# Patient Record
Sex: Male | Born: 1980 | Race: Black or African American | Hispanic: No | Marital: Married | Smoking: Never smoker
Health system: Southern US, Community
[De-identification: ages and names within clinical notes are randomized; demographics above are authoritative.]

## PROBLEM LIST (undated history)

## (undated) DIAGNOSIS — E119 Type 2 diabetes mellitus without complications: Secondary | ICD-10-CM

## (undated) DIAGNOSIS — I10 Essential (primary) hypertension: Secondary | ICD-10-CM

---

## 2009-09-10 ENCOUNTER — Emergency Department (HOSPITAL_COMMUNITY): Admission: EM | Admit: 2009-09-10 | Discharge: 2009-09-10 | Payer: Self-pay | Admitting: Family Medicine

## 2009-11-13 ENCOUNTER — Emergency Department (HOSPITAL_COMMUNITY): Admission: EM | Admit: 2009-11-13 | Discharge: 2009-11-13 | Payer: Self-pay | Admitting: Family Medicine

## 2012-12-12 ENCOUNTER — Emergency Department (HOSPITAL_COMMUNITY): Payer: Self-pay

## 2012-12-12 ENCOUNTER — Encounter (HOSPITAL_COMMUNITY): Payer: Self-pay | Admitting: Emergency Medicine

## 2012-12-12 ENCOUNTER — Emergency Department (HOSPITAL_COMMUNITY)
Admission: EM | Admit: 2012-12-12 | Discharge: 2012-12-12 | Disposition: A | Discharge status: Home / Self Care | Payer: Self-pay | Attending: Emergency Medicine | Admitting: Emergency Medicine

## 2012-12-12 DIAGNOSIS — M79609 Pain in unspecified limb: Secondary | ICD-10-CM | POA: Insufficient documentation

## 2012-12-12 DIAGNOSIS — M79671 Pain in right foot: Secondary | ICD-10-CM

## 2012-12-12 DIAGNOSIS — F17200 Nicotine dependence, unspecified, uncomplicated: Secondary | ICD-10-CM | POA: Insufficient documentation

## 2012-12-12 MED ORDER — IBUPROFEN 800 MG PO TABS: 800.0000 mg | ORAL_TABLET | Freq: Three times a day (TID) | ORAL | Status: DC | PRN

## 2012-12-12 MED ORDER — HYDROCODONE-ACETAMINOPHEN 5-325 MG PO TABS: 1.0000 | ORAL_TABLET | Freq: Four times a day (QID) | ORAL | Status: DC | PRN

## 2012-12-12 MED ORDER — HYDROCODONE-ACETAMINOPHEN 5-325 MG PO TABS
1.0000 | ORAL_TABLET | Freq: Once | ORAL | Status: AC
  Administered 2012-12-12: 1 via ORAL
  Filled 2012-12-12: qty 1

## 2012-12-12 MED ORDER — IBUPROFEN 800 MG PO TABS
800.0000 mg | ORAL_TABLET | Freq: Once | ORAL | Status: AC
  Administered 2012-12-12: 800 mg via ORAL
  Filled 2012-12-12: qty 1

## 2012-12-12 NOTE — ED Notes (Signed)
Pain in r/foot 2 days after playing basketball. Denies trauma

## 2012-12-12 NOTE — ED Provider Notes (Signed)
   History    CSN: [id number] Arrival date & time 12/12/12  1235  First MD Initiated Contact with Patient 12/12/12 1240     Chief Complaint  Patient presents with  . Foot Pain    Pain in top of r/foot 48 hrs hours after playing basketball   (Consider location/radiation/quality/duration/timing/severity/associated sxs/prior Treatment) HPI Patient presents to the emergency department with right foot pain that began 2 days, ago.  Patient, states he played basketball on Sunday, and noticed 2 days, ago he was having pain in his right foot.  Patient, states he does not recall a trauma to the foot.  Patient, states, that it hurts to put pressure on his foot.  He states, yesterday it hurt to press against his foot, but not today.  Patient denies numbness, weakness, fever, redness, nausea, or vomiting. History reviewed. No pertinent past medical history. History reviewed. No pertinent past surgical history. Family History  Problem Relation Age of Onset  . Hypertension Father    History  Substance Use Topics  . Smoking status: Current Some Day Smoker    Types: Cigarettes  . Smokeless tobacco: Not on file  . Alcohol Use: No    Review of Systems All other systems negative except as documented in the HPI. All pertinent positives and negatives as reviewed in the HPI. Allergies  Review of patient's allergies indicates no known allergies.  Home Medications   Current Outpatient Rx  Name  Route  Sig  Dispense  Refill  . acetaminophen (TYLENOL) 500 MG tablet   Oral   Take 500 mg by mouth every 6 (six) hours as needed for pain (pain).          BP 132/82  Pulse 76  Temp(Src) 98.4 F (36.9 C) (Oral)  SpO2 100% Physical Exam  Nursing note and vitals reviewed. Constitutional: He is oriented to person, place, and time. He appears well-developed and well-nourished.  Pulmonary/Chest: Effort normal and breath sounds normal. No respiratory distress.  Musculoskeletal:       Right foot: He  exhibits swelling. He exhibits normal range of motion, no tenderness, no crepitus and no deformity.       Feet:  Neurological: He is alert and oriented to person, place, and time.  Skin: Skin is warm and dry. No rash noted. No erythema.    ED Course  Procedures (including critical care time) Patient will be advised ice and elevate his foot.  Told to return here as, needed.  He'll be given orthopedic followup as well.  X-rays were reviewed and is no acute findings.  Patient does not have any signs of cellulitis or infection  MDM    Carlyle Dolly, PA-C 12/12/12 1424

## 2012-12-12 NOTE — Progress Notes (Signed)
[facility] has seen patient. Pt stated that he was pending insurance through his job. Provided pt with primary care resources.

## 2012-12-13 NOTE — ED Provider Notes (Signed)
Medical screening examination/treatment/procedure(s) were performed by non-physician practitioner and as supervising physician I was immediately available for consultation/collaboration.  Ashle Stief, MD 12/13/12 0729 

## 2014-11-03 ENCOUNTER — Emergency Department (HOSPITAL_COMMUNITY): Payer: Self-pay

## 2014-11-03 ENCOUNTER — Encounter (HOSPITAL_COMMUNITY): Payer: Self-pay | Admitting: Emergency Medicine

## 2014-11-03 ENCOUNTER — Emergency Department (HOSPITAL_COMMUNITY)
Admission: EM | Admit: 2014-11-03 | Discharge: 2014-11-03 | Disposition: A | Discharge status: Home / Self Care | Payer: Self-pay | Attending: Emergency Medicine | Admitting: Emergency Medicine

## 2014-11-03 DIAGNOSIS — Y92838 Other recreation area as the place of occurrence of the external cause: Secondary | ICD-10-CM | POA: Insufficient documentation

## 2014-11-03 DIAGNOSIS — S4991XA Unspecified injury of right shoulder and upper arm, initial encounter: Secondary | ICD-10-CM | POA: Insufficient documentation

## 2014-11-03 DIAGNOSIS — X58XXXA Exposure to other specified factors, initial encounter: Secondary | ICD-10-CM | POA: Insufficient documentation

## 2014-11-03 DIAGNOSIS — Y9367 Activity, basketball: Secondary | ICD-10-CM | POA: Insufficient documentation

## 2014-11-03 DIAGNOSIS — Y998 Other external cause status: Secondary | ICD-10-CM | POA: Insufficient documentation

## 2014-11-03 DIAGNOSIS — Z791 Long term (current) use of non-steroidal anti-inflammatories (NSAID): Secondary | ICD-10-CM | POA: Insufficient documentation

## 2014-11-03 DIAGNOSIS — Z720 Tobacco use: Secondary | ICD-10-CM | POA: Insufficient documentation

## 2014-11-03 DIAGNOSIS — Z79899 Other long term (current) drug therapy: Secondary | ICD-10-CM | POA: Insufficient documentation

## 2014-11-03 MED ORDER — KETOROLAC TROMETHAMINE 60 MG/2ML IM SOLN
60.0000 mg | Freq: Once | INTRAMUSCULAR | Status: AC
  Administered 2014-11-03: 60 mg via INTRAMUSCULAR
  Filled 2014-11-03: qty 2
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
## 2014-11-03 NOTE — Discharge Instructions (Signed)
Return to the emergency room with worsening of symptoms, new symptoms or with symptoms that are concerning, especially fevers, redness, swelling, numbness, tingling or weakness. RICE: Rest, Ice (three cycles of 20 mins on, off at least twice a day), compression/brace, elevation. Heating pad works well for back pain. Ibuprofen  (2 tablets ) every 5-6 hours for 3-5 days. Wear the sling for comfort for the first 24-48 hours. Make sure you are moving her shoulder and full range of motion to prevent adhesive capsulitis. Follow up with PCP/orthopedist if symptoms worsen or are persistent. Read below information and follow recommendations. Impingement Syndrome, Rotator Cuff, Bursitis with Rehab Impingement syndrome is a condition that involves inflammation of the tendons of the rotator cuff and the subacromial bursa, that causes pain in the shoulder. The rotator cuff consists of four tendons and muscles that control much of the shoulder and upper arm function. The subacromial bursa is a fluid filled sac that helps reduce friction between the rotator cuff and one of the bones of the shoulder (acromion). Impingement syndrome is usually an overuse injury that causes swelling of the bursa (bursitis), swelling of the tendon (tendonitis), and/or a tear of the tendon (strain). Strains are classified into three categories. Grade 1 strains cause pain, but the tendon is not lengthened. Grade 2 strains include a lengthened ligament, due to the ligament being stretched or partially ruptured. With grade 2 strains there is still function, although the function may be decreased. Grade 3 strains include a complete tear of the tendon or muscle, and function is usually impaired. SYMPTOMS   Pain around the shoulder, often at the outer portion of the upper arm.  Pain that gets worse with shoulder function, especially when reaching overhead or lifting.  Sometimes, aching when not using the arm.  Pain that  wakes you up at night.  Sometimes, tenderness, swelling, warmth, or redness over the affected area.  Loss of strength.  Limited motion of the shoulder, especially reaching behind the back (to the back pocket or to unhook bra) or across your body.  Crackling sound (crepitation) when moving the arm.  Biceps tendon pain and inflammation (in the front of the shoulder). Worse when bending the elbow or lifting. CAUSES  Impingement syndrome is often an overuse injury, in which chronic (repetitive) motions cause the tendons or bursa to become inflamed. A strain occurs when a force is paced on the tendon or muscle that is greater than it can withstand. Common mechanisms of injury include: Stress from sudden increase in duration, frequency, or intensity of training.  Direct hit (trauma) to the shoulder.  Aging, erosion of the tendon with normal use.  Bony bump on shoulder (acromial spur). RISK INCREASES WITH:  Contact sports (football, wrestling, boxing).  Throwing sports (baseball, tennis, volleyball).  Weightlifting and bodybuilding.  Heavy labor.  Previous injury to the rotator cuff, including impingement.  Poor shoulder strength and flexibility.  Failure to warm up properly before activity.  Inadequate protective equipment.  Old age.  Bony bump on shoulder (acromial spur). PREVENTION   Warm up and stretch properly before activity.  Allow for adequate recovery between workouts.  Maintain physical fitness:  Strength, flexibility, and endurance.  Cardiovascular fitness.  Learn and use proper exercise technique. PROGNOSIS  If treated properly, impingement syndrome usually goes away within 6 weeks. Sometimes surgery is required.  RELATED COMPLICATIONS   Longer healing time if not properly treated, or if not given enough time to heal.  Recurring symptoms, that result in  a chronic condition.  Shoulder stiffness, frozen shoulder, or loss of motion.  Rotator cuff  tendon tear.  Recurring symptoms, especially if activity is resumed too soon, with overuse, with a direct blow, or when using poor technique. TREATMENT  Treatment first involves the use of ice and medicine, to reduce pain and inflammation. The use of strengthening and stretching exercises may help reduce pain with activity. These exercises may be performed at home or with a therapist. If non-surgical treatment is unsuccessful after more than 6 months, surgery may be advised. After surgery and rehabilitation, activity is usually possible in 3 months.  MEDICATION  If pain medicine is needed, nonsteroidal anti-inflammatory medicines (aspirin and ibuprofen), or other minor pain relievers (acetaminophen), are often advised.  Do not take pain medicine for 7 days before surgery.  Prescription pain relievers may be given, if your caregiver thinks they are needed. Use only as directed and only as much as you need.  Corticosteroid injections may be given by your caregiver. These injections should be reserved for the most serious cases, because they may only be given a certain number of times. HEAT AND COLD  Cold treatment (icing) should be applied for 10 to 15 minutes every 2 to 3 hours for inflammation and pain, and immediately after activity that aggravates your symptoms. Use ice packs or an ice massage.  Heat treatment may be used before performing stretching and strengthening activities prescribed by your caregiver, physical therapist, or athletic trainer. Use a heat pack or a warm water soak. SEEK MEDICAL CARE IF:   Symptoms get worse or do not improve in 4 to 6 weeks, despite treatment.  New, unexplained symptoms develop. (Drugs used in treatment may produce side effects.) EXERCISES  RANGE OF MOTION (ROM) AND STRETCHING EXERCISES - Impingement Syndrome (Rotator Cuff  Tendinitis, Bursitis) These exercises may help you when beginning to rehabilitate your injury. Your symptoms may go away with or  without further involvement from your physician, physical therapist or athletic trainer. While completing these exercises, remember:   Restoring tissue flexibility helps normal motion to return to the joints. This allows healthier, less painful movement and activity.  An effective stretch should be held for at least 30 seconds.  A stretch should never be painful. You should only feel a gentle lengthening or release in the stretched tissue. STRETCH - Flexion, Standing  Stand with good posture. With an underhand grip on your right / left hand, and an overhand grip on the opposite hand, grasp a broomstick or cane so that your hands are a little more than shoulder width apart.  Keeping your right / left elbow straight and shoulder muscles relaxed, push the stick with your opposite hand, to raise your right / left arm in front of your body and then overhead. Raise your arm until you feel a stretch in your right / left shoulder, but before you have increased shoulder pain.  Try to avoid shrugging your right / left shoulder as your arm rises, by keeping your shoulder blade tucked down and toward your mid-back spine. Hold for __________ seconds.  Slowly return to the starting position. Repeat __________ times. Complete this exercise __________ times per day. STRETCH - Abduction, Supine  Lie on your back. With an underhand grip on your right / left hand and an overhand grip on the opposite hand, grasp a broomstick or cane so that your hands are a little more than shoulder width apart.  Keeping your right / left elbow straight and your  shoulder muscles relaxed, push the stick with your opposite hand, to raise your right / left arm out to the side of your body and then overhead. Raise your arm until you feel a stretch in your right / left shoulder, but before you have increased shoulder pain.  Try to avoid shrugging your right / left shoulder as your arm rises, by keeping your shoulder blade tucked down  and toward your mid-back spine. Hold for __________ seconds.  Slowly return to the starting position. Repeat __________ times. Complete this exercise __________ times per day. ROM - Flexion, Active-Assisted  Lie on your back. You may bend your knees for comfort.  Grasp a broomstick or cane so your hands are about shoulder width apart. Your right / left hand should grip the end of the stick, so that your hand is positioned "thumbs-up," as if you were about to shake hands.  Using your healthy arm to lead, raise your right / left arm overhead, until you feel a gentle stretch in your shoulder. Hold for __________ seconds.  Use the stick to assist in returning your right / left arm to its starting position. Repeat __________ times. Complete this exercise __________ times per day.  ROM - Internal Rotation, Supine   Lie on your back on a firm surface. Place your right / left elbow about 60 degrees away from your side. Elevate your elbow with a folded towel, so that the elbow and shoulder are the same height.  Using a broomstick or cane and your strong arm, pull your right / left hand toward your body until you feel a gentle stretch, but no increase in your shoulder pain. Keep your shoulder and elbow in place throughout the exercise.  Hold for __________ seconds. Slowly return to the starting position. Repeat __________ times. Complete this exercise __________ times per day. STRETCH - Internal Rotation  Place your right / left hand behind your back, palm up.  Throw a towel or belt over your opposite shoulder. Grasp the towel with your right / left hand.  While keeping an upright posture, gently pull up on the towel, until you feel a stretch in the front of your right / left shoulder.  Avoid shrugging your right / left shoulder as your arm rises, by keeping your shoulder blade tucked down and toward your mid-back spine.  Hold for __________ seconds. Release the stretch, by lowering your  healthy hand. Repeat __________ times. Complete this exercise __________ times per day. ROM - Internal Rotation   Using an underhand grip, grasp a stick behind your back with both hands.  While standing upright with good posture, slide the stick up your back until you feel a mild stretch in the front of your shoulder.  Hold for __________ seconds. Slowly return to your starting position. Repeat __________ times. Complete this exercise __________ times per day.  STRETCH - Posterior Shoulder Capsule   Stand or sit with good posture. Grasp your right / left elbow and draw it across your chest, keeping it at the same height as your shoulder.  Pull your elbow, so your upper arm comes in closer to your chest. Pull until you feel a gentle stretch in the back of your shoulder.  Hold for __________ seconds. Repeat __________ times. Complete this exercise __________ times per day. STRENGTHENING EXERCISES - Impingement Syndrome (Rotator Cuff Tendinitis, Bursitis) These exercises may help you when beginning to rehabilitate your injury. They may resolve your symptoms with or without further involvement from your physician,  physical therapist or athletic trainer. While completing these exercises, remember:  Muscles can gain both the endurance and the strength needed for everyday activities through controlled exercises.  Complete these exercises as instructed by your physician, physical therapist or athletic trainer. Increase the resistance and repetitions only as guided.  You may experience muscle soreness or fatigue, but the pain or discomfort you are trying to eliminate should never worsen during these exercises. If this pain does get worse, stop and make sure you are following the directions exactly. If the pain is still present after adjustments, discontinue the exercise until you can discuss the trouble with your clinician.  During your recovery, avoid activity or exercises which involve actions  that place your injured hand or elbow above your head or behind your back or head. These positions stress the tissues which you are trying to heal. STRENGTH - Scapular Depression and Adduction   With good posture, sit on a firm chair. Support your arms in front of you, with pillows, arm rests, or on a table top. Have your elbows in line with the sides of your body.  Gently draw your shoulder blades down and toward your mid-back spine. Gradually increase the tension, without tensing the muscles along the top of your shoulders and the back of your neck.  Hold for __________ seconds. Slowly release the tension and relax your muscles completely before starting the next repetition.  After you have practiced this exercise, remove the arm support and complete the exercise in standing as well as sitting position. Repeat __________ times. Complete this exercise __________ times per day.  STRENGTH - Shoulder Abductors, Isometric  With good posture, stand or sit about 4-6 inches from a wall, with your right / left side facing the wall.  Bend your right / left elbow. Gently press your right / left elbow into the wall. Increase the pressure gradually, until you are pressing as hard as you can, without shrugging your shoulder or increasing any shoulder discomfort.  Hold for __________ seconds.  Release the tension slowly. Relax your shoulder muscles completely before you begin the next repetition. Repeat __________ times. Complete this exercise __________ times per day.  STRENGTH - External Rotators, Isometric  Keep your right / left elbow at your side and bend it 90 degrees.  Step into a door frame so that the outside of your right / left wrist can press against the door frame without your upper arm leaving your side.  Gently press your right / left wrist into the door frame, as if you were trying to swing the back of your hand away from your stomach. Gradually increase the tension, until you are  pressing as hard as you can, without shrugging your shoulder or increasing any shoulder discomfort.  Hold for __________ seconds.  Release the tension slowly. Relax your shoulder muscles completely before you begin the next repetition. Repeat __________ times. Complete this exercise __________ times per day.  STRENGTH - Supraspinatus   Stand or sit with good posture. Grasp a __________ weight, or an exercise band or tubing, so that your hand is "thumbs-up," like you are shaking hands.  Slowly lift your right / left arm in a "V" away from your thigh, diagonally into the space between your side and straight ahead. Lift your hand to shoulder height or as far as you can, without increasing any shoulder pain. At first, many people do not lift their hands above shoulder height.  Avoid shrugging your right / left shoulder as  your arm rises, by keeping your shoulder blade tucked down and toward your mid-back spine.  Hold for __________ seconds. Control the descent of your hand, as you slowly return to your starting position. Repeat __________ times. Complete this exercise __________ times per day.  STRENGTH - External Rotators  Secure a rubber exercise band or tubing to a fixed object (table, pole) so that it is at the same height as your right / left elbow when you are standing or sitting on a firm surface.  Stand or sit so that the secured exercise band is at your uninjured side.  Bend your right / left elbow 90 degrees. Place a folded towel or small pillow under your right / left arm, so that your elbow is a few inches away from your side.  Keeping the tension on the exercise band, pull it away from your body, as if pivoting on your elbow. Be sure to keep your body steady, so that the movement is coming only from your rotating shoulder.  Hold for __________ seconds. Release the tension in a controlled manner, as you return to the starting position. Repeat __________ times. Complete this  exercise __________ times per day.  STRENGTH - Internal Rotators   Secure a rubber exercise band or tubing to a fixed object (table, pole) so that it is at the same height as your right / left elbow when you are standing or sitting on a firm surface.  Stand or sit so that the secured exercise band is at your right / left side.  Bend your elbow 90 degrees. Place a folded towel or small pillow under your right / left arm so that your elbow is a few inches away from your side.  Keeping the tension on the exercise band, pull it across your body, toward your stomach. Be sure to keep your body steady, so that the movement is coming only from your rotating shoulder.  Hold for __________ seconds. Release the tension in a controlled manner, as you return to the starting position. Repeat __________ times. Complete this exercise __________ times per day.  STRENGTH - Scapular Protractors, Standing   Stand arms length away from a wall. Place your hands on the wall, keeping your elbows straight.  Begin by dropping your shoulder blades down and toward your mid-back spine.  To strengthen your protractors, keep your shoulder blades down, but slide them forward on your rib cage. It will feel as if you are lifting the back of your rib cage away from the wall. This is a subtle motion and can be challenging to complete. Ask your caregiver for further instruction, if you are not sure you are doing the exercise correctly.  Hold for __________ seconds. Slowly return to the starting position, resting the muscles completely before starting the next repetition. Repeat __________ times. Complete this exercise __________ times per day. STRENGTH - Scapular Protractors, Supine  Lie on your back on a firm surface. Extend your right / left arm straight into the air while holding a __________ weight in your hand.  Keeping your head and back in place, lift your shoulder off the floor.  Hold for __________ seconds. Slowly  return to the starting position, and allow your muscles to relax completely before starting the next repetition. Repeat __________ times. Complete this exercise __________ times per day. STRENGTH - Scapular Protractors, Quadruped  Get onto your hands and knees, with your shoulders directly over your hands (or as close as you can be, comfortably).  Keeping  your elbows locked, lift the back of your rib cage up into your shoulder blades, so your mid-back rounds out. Keep your neck muscles relaxed.  Hold this position for __________ seconds. Slowly return to the starting position and allow your muscles to relax completely before starting the next repetition. Repeat __________ times. Complete this exercise __________ times per day.  STRENGTH - Scapular Retractors  Secure a rubber exercise band or tubing to a fixed object (table, pole), so that it is at the height of your shoulders when you are either standing, or sitting on a firm armless chair.  With a palm down grip, grasp an end of the band in each hand. Straighten your elbows and lift your hands straight in front of you, at shoulder height. Step back, away from the secured end of the band, until it becomes tense.  Squeezing your shoulder blades together, draw your elbows back toward your sides, as you bend them. Keep your upper arms lifted away from your body throughout the exercise.  Hold for __________ seconds. Slowly ease the tension on the band, as you reverse the directions and return to the starting position. Repeat __________ times. Complete this exercise __________ times per day. STRENGTH - Shoulder Extensors   Secure a rubber exercise band or tubing to a fixed object (table, pole) so that it is at the height of your shoulders when you are either standing, or sitting on a firm armless chair.  With a thumbs-up grip, grasp an end of the band in each hand. Straighten your elbows and lift your hands straight in front of you, at shoulder  height. Step back, away from the secured end of the band, until it becomes tense.  Squeezing your shoulder blades together, pull your hands down to the sides of your thighs. Do not allow your hands to go behind you.  Hold for __________ seconds. Slowly ease the tension on the band, as you reverse the directions and return to the starting position. Repeat __________ times. Complete this exercise __________ times per day.  STRENGTH - Scapular Retractors and External Rotators   Secure a rubber exercise band or tubing to a fixed object (table, pole) so that it is at the height as your shoulders, when you are either standing, or sitting on a firm armless chair.  With a palm down grip, grasp an end of the band in each hand. Bend your elbows 90 degrees and lift your elbows to shoulder height, at your sides. Step back, away from the secured end of the band, until it becomes tense.  Squeezing your shoulder blades together, rotate your shoulders so that your upper arms and elbows remain stationary, but your fists travel upward to head height.  Hold for __________ seconds. Slowly ease the tension on the band, as you reverse the directions and return to the starting position. Repeat __________ times. Complete this exercise __________ times per day.  STRENGTH - Scapular Retractors and External Rotators, Rowing   Secure a rubber exercise band or tubing to a fixed object (table, pole) so that it is at the height of your shoulders, when you are either standing, or sitting on a firm armless chair.  With a palm down grip, grasp an end of the band in each hand. Straighten your elbows and lift your hands straight in front of you, at shoulder height. Step back, away from the secured end of the band, until it becomes tense.  Step 1: Squeeze your shoulder blades together. Bending your elbows, draw  your hands to your chest, as if you are rowing a boat. At the end of this motion, your hands and elbow should be at  shoulder height and your elbows should be out to your sides.  Step 2: Rotate your shoulders, to raise your hands above your head. Your forearms should be vertical and your upper arms should be horizontal.  Hold for __________ seconds. Slowly ease the tension on the band, as you reverse the directions and return to the starting position. Repeat __________ times. Complete this exercise __________ times per day.  STRENGTH - Scapular Depressors  Find a sturdy chair without wheels, such as a dining room chair.  Keeping your feet on the floor, and your hands on the chair arms, lift your bottom up from the seat, and lock your elbows.  Keeping your elbows straight, allow gravity to pull your body weight down. Your shoulders will rise toward your ears.  Raise your body against gravity by drawing your shoulder blades down your back, shortening the distance between your shoulders and ears. Although your feet should always maintain contact with the floor, your feet should progressively support less body weight, as you get stronger.  Hold for __________ seconds. In a controlled and slow manner, lower your body weight to begin the next repetition. Repeat __________ times. Complete this exercise __________ times per day.  Document Released: 05/22/2005 Document Revised: 08/14/2011 Document Reviewed: 09/03/2008 [facility] Patient Information 2015 [place], [place]. This information is not intended to replace advice given to you by your health care provider. Make sure you discuss any questions you have with your health care provider.

## 2014-11-03 NOTE — ED Provider Notes (Signed)
CSN: [id number]     Arrival date & time 11/03/14  1533 History  This chart was scribed for a non-physician practitioner, Oswaldo Conroy, PA-C working with Lorre Nick, MD by [place] Peace, ED Scribe. The patient was seen in [facility]. The patient's care was started at 5:44 PM.    Chief Complaint  Patient presents with  . Shoulder Injury     Patient is a 34 y.o. male presenting with shoulder injury. The history is provided by the patient. No language interpreter was used.  Shoulder Injury  HPI Comments: Carlos Mccoy is a 34 y.o. male who presents to the Emergency Department complaining of new improving right shoulder injury onset 1 day prior that occurred while playing basketball. Pt states that certain movements make the pain worse. Pt states that he has tried Advil that provide slight relief. Pt states that he feels as if his shoulder is slightly "slumped." No numbness, tingling, redness, swelling, fever or chills.    History reviewed. No pertinent past medical history. History reviewed. No pertinent past surgical history. Family History  Problem Relation Age of Onset  . Hypertension Father    History  Substance Use Topics  . Smoking status: Current Some Day Smoker    Types: Cigarettes  . Smokeless tobacco: Not on file  . Alcohol Use: No    Review of Systems  Constitutional: Negative for fever and chills.  Musculoskeletal: Positive for arthralgias. Negative for joint swelling.  Skin: Negative for color change.  Neurological: Negative for weakness and numbness.      Allergies  Review of patient's allergies indicates no known allergies.  Home Medications   Prior to Admission medications   Medication Sig Start Date End Date Taking? Authorizing Provider  acetaminophen (TYLENOL) 500 MG tablet Take 500 mg by mouth every 6 (six) hours as needed for pain (pain).    Historical Provider, MD  HYDROcodone-acetaminophen (NORCO/VICODIN) 5-325 MG per tablet Take 1 tablet by  mouth every 6 (six) hours as needed for pain. 12/12/12   Charlestine Night, PA-C  ibuprofen (ADVIL,MOTRIN) 800 MG tablet Take 1 tablet (800 mg total) by mouth every 8 (eight) hours as needed for pain. 12/12/12   Christopher Lawyer, PA-C   BP 124/77 mmHg  Pulse 69  Temp(Src) 98.6 F (37 C) (Oral)  Resp 18  Ht  (1.803 m)  Wt 255 lb (115.667 kg)  BMI 35.58 kg/m2  SpO2 100%  Physical Exam  Constitutional: He appears well-developed and well-nourished. No distress.  HENT:  Head: Normocephalic and atraumatic.  Eyes: Conjunctivae are normal. Right eye exhibits no discharge. Left eye exhibits no discharge.  Pulmonary/Chest: Effort normal. No respiratory distress.  Musculoskeletal: He exhibits tenderness.  Right shoulder: No clavicular step off, tenderness to [facility] joint, no right shoulder deformity. Pain worse with shoulder abduction. 5/5 strength in bilateral upper extremities, sensation intact, 2+ radial pulses.   Neurological: He is alert. Coordination normal.  Skin: He is not diaphoretic.  Psychiatric: He has a normal mood and affect. His behavior is normal.  Nursing note and vitals reviewed.   ED Course  Procedures (including critical care time) DIAGNOSTIC STUDIES: Oxygen Saturation is 100% on RA, normal by my interpretation.    COORDINATION OF CARE: 6:05 PM-Discussed treatment plan with pt at bedside and pt agreed to plan.     Labs Review Labs Reviewed - No data to display  Imaging Review Dg Shoulder Right  11/03/2014   CLINICAL DATA:  Acute right shoulder pain after basketball injury. Initial encounter.  EXAM: RIGHT SHOULDER - 2+ VIEW  COMPARISON:  None.  FINDINGS: There is no evidence of fracture or dislocation. There is no evidence of arthropathy or other focal bone abnormality. Soft tissues are unremarkable.  IMPRESSION: Normal right shoulder.   Electronically Signed   By: Lupita RaiderJames  Green Jr, M.D.   On: 11/03/2014 17:01     EKG Interpretation None     Medications   ketorolac (TORADOL) injection 60 mg (not administered)      MDM   Final diagnoses:  Right shoulder injury, initial encounter   Patient X-Ray negative for obvious fracture or dislocation. Pain managed in ED. Pt without erythema, edema or warmth to joint. Neurovascularly intact. I doubt septic arthritis. Pt with possible shoulder impingement. Pt advised to follow up with PCP/orthopedics if symptoms persist. Patient given brace while in ED, RICE, ibuprofen and conservative therapy recommended and discussed. Instructed patient on ROM exercises and wearing sling only for first 24-48hours  Discussed return precautions with patient. Discussed all results and patient verbalizes understanding and agrees with plan.  I personally performed the services described in this documentation, which was scribed in my presence. The recorded information has been reviewed and is accurate.   Oswaldo ConroyVictoria Amita Atayde, PA-C 11/03/14 1817  Lorre NickAnthony Allen, MD 11/03/14 [phone]

## 2016-06-13 IMAGING — CR DG SHOULDER 2+V*R*
2 series · 2 of 2 positions shown · non-contrast
Comparison: None.

CLINICAL DATA: Acute right shoulder pain after basketball injury.
Initial encounter.

EXAM:
RIGHT SHOULDER - 2+ VIEW

[w shoulder external right]
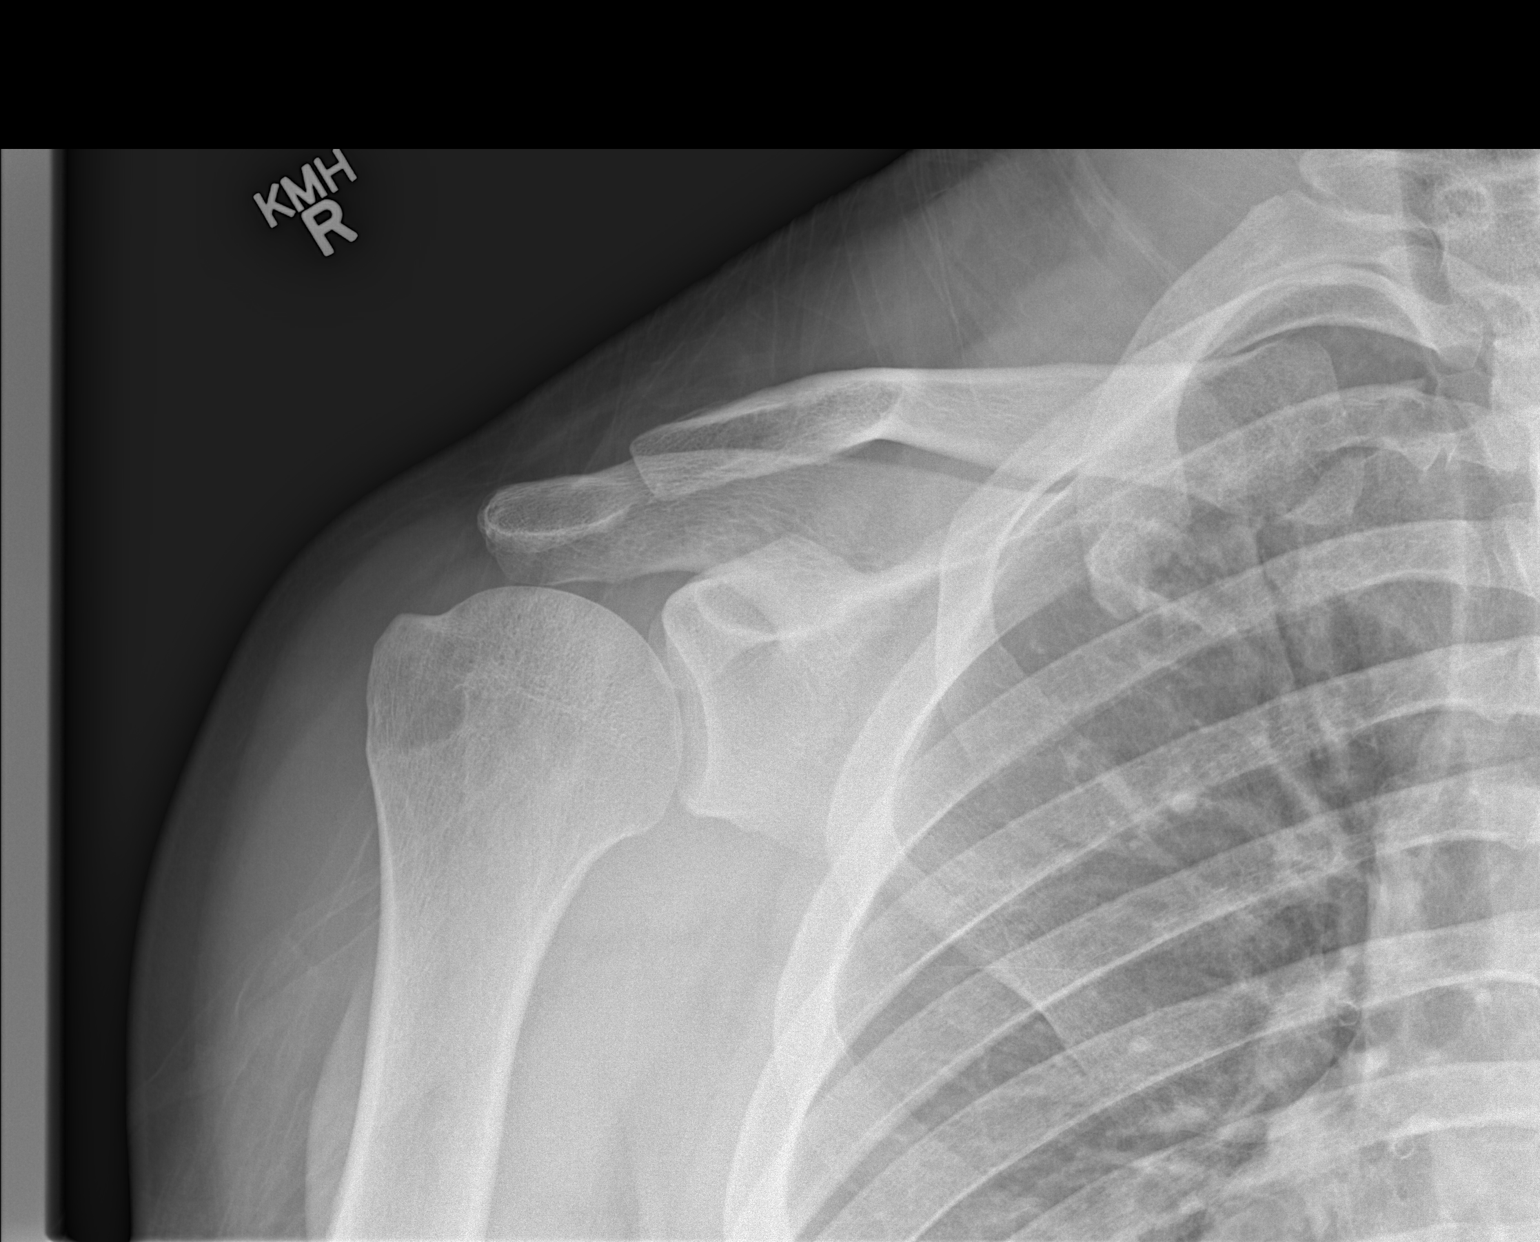

[w shoulder y-view right]
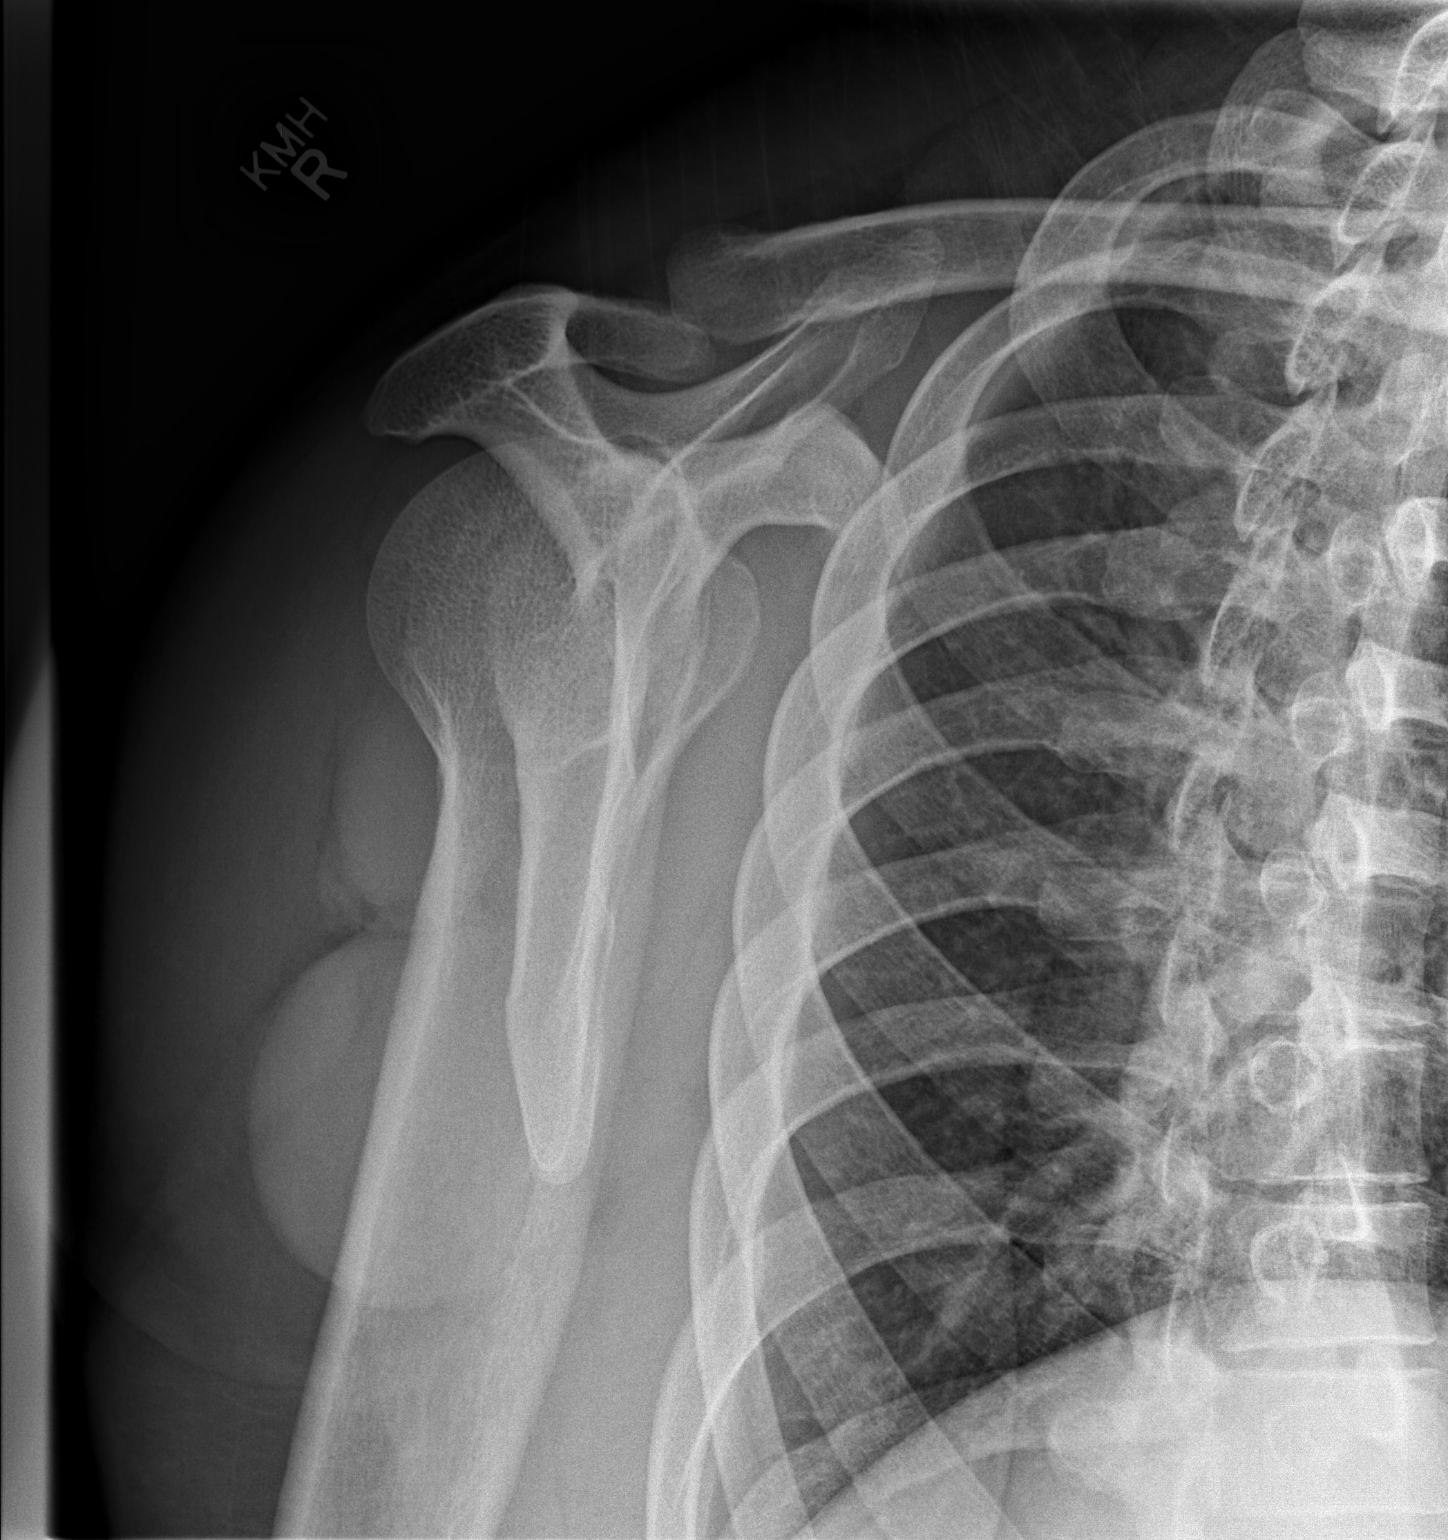

[2 of 2 positions shown; findings below may reference images not displayed]

FINDINGS: There is no evidence of fracture or dislocation. There is no
evidence of arthropathy or other focal bone abnormality. Soft
tissues are unremarkable.
IMPRESSION: Normal right shoulder.

## 2017-01-12 ENCOUNTER — Emergency Department (HOSPITAL_COMMUNITY)
Admission: EM | Admit: 2017-01-12 | Discharge: 2017-01-12 | Disposition: A | Discharge status: Home / Self Care | Payer: Self-pay | Attending: Emergency Medicine | Admitting: Emergency Medicine

## 2017-01-12 DIAGNOSIS — S86111A Strain of other muscle(s) and tendon(s) of posterior muscle group at lower leg level, right leg, initial encounter: Secondary | ICD-10-CM | POA: Insufficient documentation

## 2017-01-12 DIAGNOSIS — F17210 Nicotine dependence, cigarettes, uncomplicated: Secondary | ICD-10-CM | POA: Insufficient documentation

## 2017-01-12 DIAGNOSIS — Y9367 Activity, basketball: Secondary | ICD-10-CM | POA: Insufficient documentation

## 2017-01-12 DIAGNOSIS — X500XXA Overexertion from strenuous movement or load, initial encounter: Secondary | ICD-10-CM | POA: Insufficient documentation

## 2017-01-12 DIAGNOSIS — Y92310 Basketball court as the place of occurrence of the external cause: Secondary | ICD-10-CM | POA: Insufficient documentation

## 2017-01-12 DIAGNOSIS — Y999 Unspecified external cause status: Secondary | ICD-10-CM | POA: Insufficient documentation

## 2017-01-12 MED ORDER — METHOCARBAMOL 500 MG PO TABS: 1000.0000 mg | ORAL_TABLET | Freq: Three times a day (TID) | ORAL | 0 refills | Status: DC | PRN

## 2017-01-12 MED ORDER — NAPROXEN 500 MG PO TABS: 500.0000 mg | ORAL_TABLET | Freq: Two times a day (BID) | ORAL | 0 refills | Status: DC

## 2017-01-12 NOTE — ED Provider Notes (Signed)
[facility]-EMERGENCY DEPT Provider Note   CSN: [id number] Arrival date & time: 01/12/17  0845     History   Chief Complaint Chief Complaint  Patient presents with  . Leg Injury    right    HPI Carlos Mccoy is a 36 y.o. male.  HPI Patient was playing basketball day before yesterday and came down on his right leg from a jump. He immediately felt a big pop in the back of his calf. He reports at first he thought somebody might I am really hard in the calf. He reports it was immediately painful and started to get more and more swollen. Patient reports he can put some weight on it at a particular angle but trying to go up or down stairs is very difficult. He has been using crutches from a family member. He has taken ibuprofen with some relief. No past medical history on file.  There are no active problems to display for this patient.   No past surgical history on file.     Home Medications    Prior to Admission medications   Medication Sig Start Date End Date Taking? Authorizing Provider  acetaminophen (TYLENOL) 500 MG tablet Take 500 mg by mouth every 6 (six) hours as needed for pain (pain).    [provider]  HYDROcodone-acetaminophen (NORCO/VICODIN) 5-325 MG per tablet Take 1 tablet by mouth every 6 (six) hours as needed for pain. 12/12/12   Lawyer, Cristal Deerhristopher, PA-C  ibuprofen (ADVIL,MOTRIN) 800 MG tablet Take 1 tablet (800 mg total) by mouth every 8 (eight) hours as needed for pain. 12/12/12   Lawyer, Cristal Deerhristopher, PA-C  methocarbamol (ROBAXIN) 500 MG tablet Take 2 tablets (1,000 mg total) by mouth every 8 (eight) hours as needed for muscle spasms. 01/12/17   Arby BarrettePfeiffer, Leslee Suire, MD  naproxen (NAPROSYN) 500 MG tablet Take 1 tablet (500 mg total) by mouth 2 (two) times daily. 01/12/17   Arby BarrettePfeiffer, Kabeer Hoagland, MD    Family History Family History  Problem Relation Age of Onset  . Hypertension Father     Social History Social History  Substance Use Topics  . Smoking status:  Current Some Day Smoker    Types: Cigarettes  . Smokeless tobacco: Not on file  . Alcohol use No     Allergies   Patient has no known allergies.   Review of Systems Review of Systems Constitutional: No fever no chills no malaise Respiratory: No shortness of breath no cough no chest pain  Physical Exam Updated Vital Signs BP 140/89   Pulse 97   Temp 98.4 F (36.9 C)   Resp 18   SpO2 99%   Physical Exam  Constitutional: He is oriented to person, place, and time. He appears well-developed and well-nourished. No distress.  HENT:  Head: Normocephalic and atraumatic.  Eyes: EOM are normal.  Pulmonary/Chest: Effort normal.  Musculoskeletal:  Patient has large diffuse swelling of the right gastrocnemius. It is about 30-40% swollen relative to the left. At baseline however the patient does have very large calf muscles. Patient placed in supine position and Achilles tested. Achilles intact. No pain to palpation along the Achilles and although patient is tender to compression of the calf, normal extension of the foot. Bilateral dorsalis pedis pulses 2+ and symmetric. No general edema of the lower portion of the leg. No pain along palpation of the bony prominences the tibia.  Neurological: He is alert and oriented to person, place, and time. No cranial nerve deficit. He exhibits normal muscle tone. Coordination  normal.  Skin: Skin is warm and dry.  Psychiatric: He has a normal mood and affect.     ED Treatments / Results  Labs (all labs ordered are listed, but only abnormal results are displayed) Labs Reviewed - No data to display  EKG  EKG Interpretation None       Radiology No results found.  Procedures Procedures (including critical care time)  Medications Ordered in ED Medications - No data to display   Initial Impression / Assessment and Plan / ED Course  I have reviewed the triage vital signs and the nursing notes.  Pertinent labs & imaging results that were  available during my care of the patient were reviewed by me and considered in my medical decision making (see chart for details).      Final Clinical Impressions(s) / ED Diagnoses   Final diagnoses:  Gastrocnemius muscle rupture, right, initial encounter   Physical exam consistent with significant gastrocnemius partial tear versus rupture. No neurovascular compromise. Patient does not have any foot pain or paresthesia and normal neurovascular exam. Patient has been counseled on signs of compartment syndrome. Patient is counseled on compression elevating icing and nonweightbearing for the next several days. He is counseled on a follow-up plan within 5-7 days. New Prescriptions New Prescriptions   METHOCARBAMOL (ROBAXIN) 500 MG TABLET    Take 2 tablets (1,000 mg total) by mouth every 8 (eight) hours as needed for muscle spasms.   NAPROXEN (NAPROSYN) 500 MG TABLET    Take 1 tablet (500 mg total) by mouth 2 (two) times daily.     Arby Barrette, MD 01/12/17 [phone]

## 2017-01-12 NOTE — Discharge Instructions (Signed)
See your family doctor or orthopedic doctor for recheck in the next 5-7 days.  Although there are no signs of compartment syndrome at this time, follow precautionary statements and return if any symptoms develop.  Wear an Ace wrap or a knee sleeve on your calf to help with swelling. Once the swelling goes down adequately that a walking boot fits, you may start using a walking boot for weightbearing.

## 2017-01-12 NOTE — ED Notes (Signed)
ACE WRAPPED APPLIED BY JONNA NT TO RT LE. PT TOLERATED. GOOD CMS

## 2017-01-12 NOTE — ED Triage Notes (Signed)
Pt reports right leg injury when playing Basketball 2 days ago. Obvious right calf swelling. Pt  unable to bare weight on injured extremity. No redness nor hot to touch. Denies shortness of breath nor chest pain.

## 2024-01-11 ENCOUNTER — Ambulatory Visit: Payer: Self-pay

## 2024-01-12 ENCOUNTER — Encounter (HOSPITAL_COMMUNITY): Payer: Self-pay | Admitting: Internal Medicine

## 2024-01-12 ENCOUNTER — Ambulatory Visit

## 2024-01-12 ENCOUNTER — Ambulatory Visit
Admission: RE | Admit: 2024-01-12 | Discharge: 2024-01-12 | Disposition: A | Discharge status: Home / Self Care | Attending: Physician Assistant | Admitting: Physician Assistant

## 2024-01-12 ENCOUNTER — Other Ambulatory Visit: Payer: Self-pay

## 2024-01-12 ENCOUNTER — Inpatient Hospital Stay (HOSPITAL_COMMUNITY)
Admission: EM | Admit: 2024-01-12 | Discharge: 2024-01-15 | DRG: 638 | Disposition: A | Discharge status: Home / Self Care | Attending: Internal Medicine | Admitting: Internal Medicine

## 2024-01-12 VITALS — BP 130/87 | HR 122 | Temp 98.1°F | Resp 19 | Ht 72.0 in | Wt 270.0 lb

## 2024-01-12 DIAGNOSIS — Z833 Family history of diabetes mellitus: Secondary | ICD-10-CM

## 2024-01-12 DIAGNOSIS — R350 Frequency of micturition: Secondary | ICD-10-CM

## 2024-01-12 DIAGNOSIS — N179 Acute kidney failure, unspecified: Secondary | ICD-10-CM | POA: Diagnosis present

## 2024-01-12 DIAGNOSIS — I10 Essential (primary) hypertension: Secondary | ICD-10-CM | POA: Diagnosis present

## 2024-01-12 DIAGNOSIS — D751 Secondary polycythemia: Secondary | ICD-10-CM | POA: Diagnosis present

## 2024-01-12 DIAGNOSIS — Z87891 Personal history of nicotine dependence: Secondary | ICD-10-CM

## 2024-01-12 DIAGNOSIS — R058 Other specified cough: Secondary | ICD-10-CM | POA: Diagnosis not present

## 2024-01-12 DIAGNOSIS — R000 Tachycardia, unspecified: Secondary | ICD-10-CM | POA: Diagnosis present

## 2024-01-12 DIAGNOSIS — Z6836 Body mass index (BMI) 36.0-36.9, adult: Secondary | ICD-10-CM

## 2024-01-12 DIAGNOSIS — R824 Acetonuria: Secondary | ICD-10-CM

## 2024-01-12 DIAGNOSIS — D72829 Elevated white blood cell count, unspecified: Secondary | ICD-10-CM | POA: Diagnosis present

## 2024-01-12 DIAGNOSIS — R030 Elevated blood-pressure reading, without diagnosis of hypertension: Secondary | ICD-10-CM | POA: Diagnosis present

## 2024-01-12 DIAGNOSIS — E66812 Obesity, class 2: Secondary | ICD-10-CM | POA: Diagnosis present

## 2024-01-12 DIAGNOSIS — R81 Glycosuria: Secondary | ICD-10-CM | POA: Diagnosis not present

## 2024-01-12 DIAGNOSIS — R739 Hyperglycemia, unspecified: Secondary | ICD-10-CM | POA: Diagnosis not present

## 2024-01-12 DIAGNOSIS — E860 Dehydration: Secondary | ICD-10-CM | POA: Diagnosis present

## 2024-01-12 DIAGNOSIS — R7989 Other specified abnormal findings of blood chemistry: Secondary | ICD-10-CM | POA: Diagnosis present

## 2024-01-12 DIAGNOSIS — E8809 Other disorders of plasma-protein metabolism, not elsewhere classified: Secondary | ICD-10-CM | POA: Diagnosis present

## 2024-01-12 DIAGNOSIS — E1110 Type 2 diabetes mellitus with ketoacidosis without coma: Secondary | ICD-10-CM | POA: Diagnosis not present

## 2024-01-12 DIAGNOSIS — Z8249 Family history of ischemic heart disease and other diseases of the circulatory system: Secondary | ICD-10-CM

## 2024-01-12 DIAGNOSIS — E871 Hypo-osmolality and hyponatremia: Secondary | ICD-10-CM | POA: Diagnosis present

## 2024-01-12 LAB — MAGNESIUM: Magnesium: 2.3 mg/dL (ref 1.7–2.4)

## 2024-01-12 LAB — I-STAT CHEM 8, ED
BUN: 21 mg/dL — ABNORMAL HIGH (ref 6–20)
Calcium, Ion: 1.22 mmol/L (ref 1.15–1.40)
Chloride: 91 mmol/L — ABNORMAL LOW (ref 98–111)
Creatinine, Ser: 1.4 mg/dL — ABNORMAL HIGH (ref 0.61–1.24)
Glucose, Bld: 637 mg/dL (ref 70–99)
HCT: 55 % — ABNORMAL HIGH (ref 39.0–52.0)
Hemoglobin: 18.7 g/dL — ABNORMAL HIGH (ref 13.0–17.0)
Potassium: 4.8 mmol/L (ref 3.5–5.1)
Sodium: 126 mmol/L — ABNORMAL LOW (ref 135–145)
TCO2: 18 mmol/L — ABNORMAL LOW (ref 22–32)

## 2024-01-12 LAB — CBC
HCT: 50.8 % (ref 39.0–52.0)
Hemoglobin: 18.2 g/dL — ABNORMAL HIGH (ref 13.0–17.0)
MCH: 30.5 pg (ref 26.0–34.0)
MCHC: 35.8 g/dL (ref 30.0–36.0)
MCV: 85.2 fL (ref 80.0–100.0)
Platelets: 362 K/uL (ref 150–400)
RBC: 5.96 MIL/uL — ABNORMAL HIGH (ref 4.22–5.81)
RDW: 11 % — ABNORMAL LOW (ref 11.5–15.5)
WBC: 12 K/uL — ABNORMAL HIGH (ref 4.0–10.5)
nRBC: 0 % (ref 0.0–0.2)

## 2024-01-12 LAB — BETA-HYDROXYBUTYRIC ACID
Beta-Hydroxybutyric Acid: 5.92 mmol/L — ABNORMAL HIGH (ref 0.05–0.27)
Beta-Hydroxybutyric Acid: 4.92 mmol/L — ABNORMAL HIGH (ref 0.05–0.27)
Beta-Hydroxybutyric Acid: 1.87 mmol/L — ABNORMAL HIGH (ref 0.05–0.27)

## 2024-01-12 LAB — LIPID PANEL
Cholesterol: 212 mg/dL — ABNORMAL HIGH (ref 0–200)
HDL: 29 mg/dL — ABNORMAL LOW (ref >40)
LDL Cholesterol: 133 mg/dL — ABNORMAL HIGH (ref 0–99)
Total CHOL/HDL Ratio: 7.3 ratio
Triglycerides: 251 mg/dL — ABNORMAL HIGH (ref <150)
VLDL: 50 mg/dL — ABNORMAL HIGH (ref 0–40)

## 2024-01-12 LAB — COMPREHENSIVE METABOLIC PANEL WITH GFR
ALT: 31 U/L (ref 0–44)
AST: 19 U/L (ref 15–41)
Albumin: 5.1 g/dL — ABNORMAL HIGH (ref 3.5–5.0)
Alkaline Phosphatase: 137 U/L — ABNORMAL HIGH (ref 38–126)
Anion gap: 23 — ABNORMAL HIGH (ref 5–15)
BUN: 20 mg/dL (ref 6–20)
CO2: 17 mmol/L — ABNORMAL LOW (ref 22–32)
Calcium: 10.3 mg/dL (ref 8.9–10.3)
Chloride: 86 mmol/L — ABNORMAL LOW (ref 98–111)
Creatinine, Ser: 1.54 mg/dL — ABNORMAL HIGH (ref 0.61–1.24)
GFR, Estimated: 58 mL/min — ABNORMAL LOW (ref >60)
Glucose, Bld: 592 mg/dL (ref 70–99)
Potassium: 4.7 mmol/L (ref 3.5–5.1)
Sodium: 126 mmol/L — ABNORMAL LOW (ref 135–145)
Total Bilirubin: 3 mg/dL — ABNORMAL HIGH (ref 0.0–1.2)
Total Protein: 9.3 g/dL — ABNORMAL HIGH (ref 6.5–8.1)

## 2024-01-12 LAB — POCT URINE DIPSTICK
Bilirubin, UA: NEGATIVE
Glucose, UA: 500 mg/dL — AB
Leukocytes, UA: NEGATIVE
Nitrite, UA: NEGATIVE
POC PROTEIN,UA: NEGATIVE
Spec Grav, UA: 1.02 (ref 1.010–1.025)
Urobilinogen, UA: 0.2 U/dL
pH, UA: 5 (ref 5.0–8.0)

## 2024-01-12 LAB — GLUCOSE, CAPILLARY
Glucose-Capillary: 480 mg/dL — ABNORMAL HIGH (ref 70–99)
Glucose-Capillary: 542 mg/dL (ref 70–99)
Glucose-Capillary: 383 mg/dL — ABNORMAL HIGH (ref 70–99)
Glucose-Capillary: 295 mg/dL — ABNORMAL HIGH (ref 70–99)
Glucose-Capillary: 251 mg/dL — ABNORMAL HIGH (ref 70–99)
Glucose-Capillary: 174 mg/dL — ABNORMAL HIGH (ref 70–99)
Glucose-Capillary: 194 mg/dL — ABNORMAL HIGH (ref 70–99)
Glucose-Capillary: 205 mg/dL — ABNORMAL HIGH (ref 70–99)
Glucose-Capillary: 191 mg/dL — ABNORMAL HIGH (ref 70–99)
Glucose-Capillary: 192 mg/dL — ABNORMAL HIGH (ref 70–99)

## 2024-01-12 LAB — BLOOD GAS, VENOUS
Acid-base deficit: 6.5 mmol/L — ABNORMAL HIGH (ref 0.0–2.0)
Bicarbonate: 19.7 mmol/L — ABNORMAL LOW (ref 20.0–28.0)
O2 Saturation: 41.9 %
Patient temperature: 37
pCO2, Ven: 41 mmHg — ABNORMAL LOW (ref 44–60)
pH, Ven: 7.29 (ref 7.25–7.43)
pO2, Ven: <31 mmHg — CL (ref 32–45)

## 2024-01-12 LAB — BASIC METABOLIC PANEL WITH GFR
Anion gap: 17 — ABNORMAL HIGH (ref 5–15)
BUN: 20 mg/dL (ref 6–20)
CO2: 21 mmol/L — ABNORMAL LOW (ref 22–32)
Calcium: 10.1 mg/dL (ref 8.9–10.3)
Chloride: 94 mmol/L — ABNORMAL LOW (ref 98–111)
Creatinine, Ser: 1.34 mg/dL — ABNORMAL HIGH (ref 0.61–1.24)
GFR, Estimated: >60 mL/min (ref >60)
Glucose, Bld: 410 mg/dL — ABNORMAL HIGH (ref 70–99)
Potassium: 4.3 mmol/L (ref 3.5–5.1)
Sodium: 132 mmol/L — ABNORMAL LOW (ref 135–145)

## 2024-01-12 LAB — CK: Total CK: 121 U/L (ref 49–397)

## 2024-01-12 LAB — GLUCOSE, POCT (MANUAL RESULT ENTRY): POCT Glucose (KUC): 600 mg/dL — AB (ref 70–99)

## 2024-01-12 LAB — MRSA NEXT GEN BY PCR, NASAL: MRSA by PCR Next Gen: NOT DETECTED

## 2024-01-12 LAB — POC SOFIA SARS ANTIGEN FIA: SARS Coronavirus 2 Ag: NEGATIVE

## 2024-01-12 LAB — PHOSPHORUS: Phosphorus: 4.1 mg/dL (ref 2.5–4.6)

## 2024-01-12 MED ORDER — LABETALOL HCL 5 MG/ML IV SOLN
10.0000 mg | INTRAVENOUS | Status: DC | PRN
  Administered 2024-01-12: 10 mg via INTRAVENOUS
  Filled 2024-01-12: qty 4

## 2024-01-12 MED ORDER — CHLORHEXIDINE GLUCONATE CLOTH 2 % EX PADS
6.0000 | MEDICATED_PAD | Freq: Every day | CUTANEOUS | Status: DC
  Administered 2024-01-12: 6 via TOPICAL

## 2024-01-12 MED ORDER — ACETAMINOPHEN 325 MG PO TABS
650.0000 mg | ORAL_TABLET | Freq: Four times a day (QID) | ORAL | Status: DC | PRN
Start: 2024-01-12 — End: 2024-01-15

## 2024-01-12 MED ORDER — ORAL CARE MOUTH RINSE: 15.0000 mL | OROMUCOSAL | Status: DC | PRN

## 2024-01-12 MED ORDER — ONDANSETRON HCL 4 MG/2ML IJ SOLN: 4.0000 mg | Freq: Four times a day (QID) | INTRAMUSCULAR | Status: DC | PRN

## 2024-01-12 MED ORDER — ONDANSETRON HCL 4 MG PO TABS
4.0000 mg | ORAL_TABLET | Freq: Four times a day (QID) | ORAL | Status: DC | PRN
Start: 2024-01-12 — End: 2024-01-15

## 2024-01-12 MED ORDER — DEXTROSE 50 % IV SOLN: 0.0000 mL | INTRAVENOUS | Status: DC | PRN

## 2024-01-12 MED ORDER — INSULIN REGULAR(HUMAN) IN NACL 100-0.9 UT/100ML-% IV SOLN
INTRAVENOUS | Status: DC
  Administered 2024-01-12: 15 [IU]/h via INTRAVENOUS
  Administered 2024-01-13: 6 [IU]/h via INTRAVENOUS
  Filled 2024-01-12 (×2): qty 100

## 2024-01-12 MED ORDER — LACTATED RINGERS IV BOLUS
1000.0000 mL | Freq: Once | INTRAVENOUS | Status: AC
  Administered 2024-01-12: 1000 mL via INTRAVENOUS

## 2024-01-12 MED ORDER — ENOXAPARIN SODIUM 60 MG/0.6ML IJ SOSY
60.0000 mg | PREFILLED_SYRINGE | INTRAMUSCULAR | Status: DC
  Filled 2024-01-12 (×3): qty 0.6

## 2024-01-12 MED ORDER — DEXTROSE IN LACTATED RINGERS 5 % IV SOLN: INTRAVENOUS | Status: DC

## 2024-01-12 MED ORDER — LACTATED RINGERS IV BOLUS
1500.0000 mL | Freq: Once | INTRAVENOUS | Status: AC
  Administered 2024-01-12: 1500 mL via INTRAVENOUS

## 2024-01-12 MED ORDER — ONDANSETRON 4 MG PO TBDP
4.0000 mg | ORAL_TABLET | Freq: Once | ORAL | Status: AC
  Administered 2024-01-12: 4 mg via ORAL

## 2024-01-12 MED ORDER — LACTATED RINGERS IV SOLN: INTRAVENOUS | Status: DC

## 2024-01-12 MED ORDER — ACETAMINOPHEN 650 MG RE SUPP: 650.0000 mg | Freq: Four times a day (QID) | RECTAL | Status: DC | PRN

## 2024-01-12 MED ORDER — POTASSIUM CHLORIDE 10 MEQ/100ML IV SOLN
10.0000 meq | INTRAVENOUS | Status: AC
  Administered 2024-01-12 (×2): 10 meq via INTRAVENOUS
  Filled 2024-01-12 (×2): qty 100

## 2024-01-12 NOTE — ED Provider Notes (Signed)
 [place] EMERGENCY DEPARTMENT AT [facility] Provider Note   CSN: [id number] Arrival date & time: 01/12/24  1103     Patient presents with: Hyperglycemia   Carlos Mccoy is a 43 y.o. male who was sent to the emergency department from urgent care due to hyperglycemia as well as ketonuria.  Patient states that he has been nauseous and had reduced appetite for a few days with his last full meal being Thursday night.  Patient only appreciates 1 episode of vomiting while attempting to take a large Mucinex pill.  He states that all he threw up was the pill.  Patient reports that he feels like there is a lot of mucus in his mouth and throat and also appreciates diffuse muscle cramping worse in his lower extremities.  Patient denies dysuria, dark urine, or gross hematuria.  He does appreciate urinary frequency as well as increased thirst. Although he has not been able to eat much patient states he has been able to tolerate liquids by mouth including water, popsicles, and Gatorade.  Patient denies any previous history of diabetes, denies significant past medical history and states that he is on no prescribed medications. Patients states that his mother is a diabetic.     Hyperglycemia Associated symptoms: nausea        Prior to Admission medications   Medication Sig Start Date End Date Taking? Authorizing Provider  acetaminophen  (TYLENOL ) 500 MG tablet Take 500 mg by mouth every 6 (six) hours as needed for pain (pain).   Yes [provider]  ibuprofen  (ADVIL ,MOTRIN ) 800 MG tablet Take 1 tablet (800 mg total) by mouth every 8 (eight) hours as needed for pain. 12/12/12  Yes Lawyer, Lonni, PA-C    Allergies: Patient has no known allergies.    Review of Systems  Gastrointestinal:  Positive for nausea.    Updated Vital Signs BP (!) 170/96   Pulse (!) (P) 0   Temp 97.8 F (36.6 C) (Oral)   Resp 14   Ht 6' 1 (1.854 m)   Wt 111.3 kg   SpO2 98%   BMI 32.37 kg/m    Physical Exam Vitals and nursing note reviewed.  Constitutional:      General: He is not in acute distress.    Appearance: Normal appearance. He is not ill-appearing, toxic-appearing or diaphoretic.     Comments: Patient overall well-appearing, alert and oriented, compliant with physical exam, non-altered  HENT:     Head: Normocephalic and atraumatic.  Eyes:     General: No scleral icterus. Cardiovascular:     Rate and Rhythm: Regular rhythm. Tachycardia present.  Pulmonary:     Effort: Pulmonary effort is normal. No respiratory distress.     Breath sounds: No wheezing, rhonchi or rales.  Abdominal:     General: Abdomen is flat. There is no distension.     Palpations: Abdomen is soft.     Tenderness: There is no abdominal tenderness. There is no right CVA tenderness, left CVA tenderness, guarding or rebound.  Musculoskeletal:        General: Normal range of motion.     Right lower leg: No edema.     Left lower leg: No edema.  Skin:    General: Skin is warm.     Capillary Refill: Capillary refill takes less than 2 seconds.  Neurological:     General: No focal deficit present.     Mental Status: He is alert and oriented to person, place, and time.  Psychiatric:  Mood and Affect: Mood normal.        Behavior: Behavior normal.     (all labs ordered are listed, but only abnormal results are displayed) Labs Reviewed  CBC - Abnormal; Notable for the following components:      Result Value   WBC 12.0 (*)    RBC 5.96 (*)    Hemoglobin 18.2 (*)    RDW 11.0 (*)    All other components within normal limits  BETA-HYDROXYBUTYRIC ACID - Abnormal; Notable for the following components:   Beta-Hydroxybutyric Acid 5.92 (*)    All other components within normal limits  COMPREHENSIVE METABOLIC PANEL WITH GFR - Abnormal; Notable for the following components:   Sodium 126 (*)    Chloride 86 (*)    CO2 17 (*)    Glucose, Bld 592 (*)    Creatinine, Ser 1.54 (*)    Total Protein  9.3 (*)    Albumin 5.1 (*)    Alkaline Phosphatase 137 (*)    Total Bilirubin 3.0 (*)    GFR, Estimated 58 (*)    Anion gap 23 (*)    All other components within normal limits  BLOOD GAS, VENOUS - Abnormal; Notable for the following components:   pCO2, Ven 41 (*)    pO2, Ven <31 (*)    Bicarbonate 19.7 (*)    Acid-base deficit 6.5 (*)    All other components within normal limits  BASIC METABOLIC PANEL WITH GFR - Abnormal; Notable for the following components:   Sodium 132 (*)    Chloride 94 (*)    CO2 21 (*)    Glucose, Bld 410 (*)    Creatinine, Ser 1.34 (*)    Anion gap 17 (*)    All other components within normal limits  BETA-HYDROXYBUTYRIC ACID - Abnormal; Notable for the following components:   Beta-Hydroxybutyric Acid 4.92 (*)    All other components within normal limits  GLUCOSE, CAPILLARY - Abnormal; Notable for the following components:   Glucose-Capillary 542 (*)    All other components within normal limits  GLUCOSE, CAPILLARY - Abnormal; Notable for the following components:   Glucose-Capillary 480 (*)    All other components within normal limits  LIPID PANEL - Abnormal; Notable for the following components:   Cholesterol 212 (*)    Triglycerides 251 (*)    HDL 29 (*)    VLDL 50 (*)    LDL Cholesterol 133 (*)    All other components within normal limits  GLUCOSE, CAPILLARY - Abnormal; Notable for the following components:   Glucose-Capillary 383 (*)    All other components within normal limits  GLUCOSE, CAPILLARY - Abnormal; Notable for the following components:   Glucose-Capillary 295 (*)    All other components within normal limits  I-STAT CHEM 8, ED - Abnormal; Notable for the following components:   Sodium 126 (*)    Chloride 91 (*)    BUN 21 (*)    Creatinine, Ser 1.40 (*)    Glucose, Bld 637 (*)    TCO2 18 (*)    Hemoglobin 18.7 (*)    HCT 55.0 (*)    All other components within normal limits  MRSA NEXT GEN BY PCR, NASAL  CK  MAGNESIUM   PHOSPHORUS  URINALYSIS, ROUTINE W REFLEX MICROSCOPIC  BASIC METABOLIC PANEL WITH GFR  BETA-HYDROXYBUTYRIC ACID  BETA-HYDROXYBUTYRIC ACID  HEMOGLOBIN A1C  HIV ANTIBODY (ROUTINE TESTING W REFLEX)  CBC  COMPREHENSIVE METABOLIC PANEL WITH GFR  CBG MONITORING, ED  I-STAT VENOUS BLOOD GAS, ED  CBG MONITORING, ED    EKG: EKG Interpretation Date/Time:  Saturday January 12 2024 11:56:22 EDT Ventricular Rate:  114 PR Interval:  145 QRS Duration:  82 QT Interval:  341 QTC Calculation: 470 R Axis:   63  Text Interpretation: Sinus tachycardia nonspecific inferior T waves No old tracing to compare Confirmed by Freddi Hamilton [phone]) on 01/12/2024 12:53:33 PM  Radiology: No results found.   Procedures   Medications Ordered in the ED  insulin  regular, human (MYXREDLIN ) 100 units/ 100 mL infusion (9.5 Units/hr Intravenous Infusion Verify 01/12/24 1608)  lactated ringers  infusion ( Intravenous New Bag/Given 01/12/24 1629)  dextrose  5 % in lactated ringers  infusion (has no administration in time range)  dextrose  50 % solution 0-50 mL (has no administration in time range)  enoxaparin  (LOVENOX ) injection 60 mg (has no administration in time range)  acetaminophen  (TYLENOL ) tablet 650 mg (has no administration in time range)    Or  acetaminophen  (TYLENOL ) suppository 650 mg (has no administration in time range)  ondansetron  (ZOFRAN ) tablet 4 mg (has no administration in time range)    Or  ondansetron  (ZOFRAN ) injection 4 mg (has no administration in time range)  labetalol  (NORMODYNE ) injection 10 mg (10 mg Intravenous Given 01/12/24 1614)  Chlorhexidine  Gluconate Cloth 2 % PADS 6 each (has no administration in time range)  Oral care mouth rinse (has no administration in time range)  lactated ringers  bolus 1,000 mL (1,000 mLs Intravenous New Bag/Given 01/12/24 1204)  potassium chloride  10 mEq in 100 mL IVPB (10 mEq Intravenous New Bag/Given 01/12/24 1610)  lactated ringers  bolus 1,500 mL ( Intravenous  Infusion Verify 01/12/24 1608)                                    Medical Decision Making Amount and/or Complexity of Data Reviewed Labs: ordered.  Risk Prescription drug management. Decision regarding hospitalization.   Patient presents to the ED for concern of hyperglycemia, ketonuria sent over from urgent care, this involves an extensive number of treatment options, and is a complaint that carries with it a high risk of complications and morbidity.  The differential diagnosis includes diabetic ketoacidosis, HHS, hyperglycemia, new onset diabetes, rhabdomyolysis, etc.   Co morbidities that complicate the patient evaluation  none   Additional history obtained:  Reviewed chart from urgent care visit earlier today prior to patient being seen in the emergency department.  Based on this chart patient had significant hyperglycemia greater than 600, ketonuria, urgent care provider was concern for diabetic ketoacidosis or HHS and recommended emergency department evaluation   Lab Tests:  I Ordered, and personally interpreted labs.  The pertinent results include: CBC - WBC 12, hemoglobin 18.2, CMP-sodium 126, chloride 86, bicarb 17, glucose 592, creatinine 1.54, total bilirubin 3, anion gap of 23, VBG - venous pH 7.29, bicarb 19.7   Cardiac Monitoring:  The patient was maintained on a cardiac monitor.  I personally viewed and interpreted the cardiac monitored which showed an underlying rhythm of: sinus tachycardia   Medicines ordered and prescription drug management:  I ordered medication including fluids for dehydration vs. DKA as well as insulin  and potassium, dextrose  as needed Reevaluation of the patient after these medicines showed that the patient stayed the same I have reviewed the patients home medicines and have made adjustments as needed   Test Considered:  None   Critical Interventions:  None  Consultations Obtained:  I requested consultation with the  hospitalist,  and discussed lab and imaging findings as well as pertinent plan - they recommend: admission for ongoing diagnosis and treatment   Problem List / ED Course:  43 year old male, multiple days of nausea, muscle cramping, 1x episode of vomiting, poor PO intake of food but able to tolerate liquids, also appreciates frequency of urination and increased thirst, seen at [facility] concern for DKA vs. HHS Vital signs stable, slight tachycardia, patient overall well-appearing, no Kussmaul breathing, non-altered DKA labs ordered as well as CK due to reported muscle cramping 1 L of LR ordered Patient was given PO Zofran  by UC less than 1.5 hours ago  Labs significant with mild DKA, including venous blood gas pH of 7.29, bicarb of 17, anion gap of 23, lactated ringers  ordered as well as insulin  and potassium, dextrose  as needed, potassium currently 4.8 Patient updated on plan and that he will need to be admitted to the hospital for evaluation, patient in agreement Spoke with hospitalist Dr. Celinda who will admit patient  Patient admitted to the hospital for ongoing diagnosis and treatment  At this time most likely diagnosis is DKA with a new diagnosis of type 2 DM, suspect that muscle cramping may be due to dehydration in presence of DKA however CK pending at this time    Reevaluation:  After the interventions noted above, I reevaluated the patient and found that they have :stayed the same   Social Determinants of Health:  none   Dispostion:  After consideration of the diagnostic results and the patients response to treatment, I feel that the patent would benefit from admission to the hospital for ongoing diagnosis and treatment.      Final diagnoses:  Hyperglycemia  Ketonuria  Diabetic ketoacidosis without coma associated with type 2 diabetes mellitus [facility])    ED Discharge Orders     None          Janetta Terrall FALCON, PA-C 01/12/24 1749    Freddi Hamilton, MD 01/13/24  [phone]

## 2024-01-12 NOTE — ED Notes (Signed)
 Pt states he just peed before coming back to room. Provided water.

## 2024-01-12 NOTE — Discharge Instructions (Addendum)
 You were seen today for concerns of generalized bodyaches, productive coughing and urinary frequency. Your urine had a significant amount of glucose or sugar in it which makes me concerned that you may have a high blood glucose. Your finger stick blood glucose level was over 600 and we are not able to provide a definitive number due to this elevation. Based on how you are feeling and your high blood glucose levels I am concerned that you are experiencing complications from undiagnosed Diabetes. However I cannot rule out other causes of your symptoms. Glucose levels this elevated with nausea and vomiting can precipitate emergency conditions and I recommend that you go to the ED for further evaluation, stabilization and management. Please have another person drive you there as it may not be safe for you to drive right now. If you have any complications on the way, please call 911 for assistance.

## 2024-01-12 NOTE — ED Provider Notes (Signed)
 [facility]    CSN: [id number] Arrival date & time: 01/12/24  9078      History   Chief Complaint Chief Complaint  Patient presents with   Nausea    Entered by patient    HPI [place] Brusca is a 43 y.o. male.   HPI  He is here for concerns of nausea and reduced appetite for the past few days, body aches and increased urinary frequency He denies dysuria or gross hematuria He reports feeling like there is a lot of mucus in his mouth and throat  He reports that he did vomit once when he was taking a large pill  He has not been able to eat much since the vomiting but has been able to tolerate liquid PO with water, popsicles and gatorade  He thinks the last time he ate was Thurs night   He denies previous hx of DM   History reviewed. No pertinent past medical history.  There are no active problems to display for this patient.   History reviewed. No pertinent surgical history.     Home Medications    Prior to Admission medications   Medication Sig Start Date End Date Taking? Authorizing Provider  acetaminophen  (TYLENOL ) 500 MG tablet Take 500 mg by mouth every 6 (six) hours as needed for pain (pain).    [provider]  HYDROcodone -acetaminophen  (NORCO/VICODIN) 5-325 MG per tablet Take 1 tablet by mouth every 6 (six) hours as needed for pain. 12/12/12   Lawyer, Lonni, PA-C  ibuprofen  (ADVIL ,MOTRIN ) 800 MG tablet Take 1 tablet (800 mg total) by mouth every 8 (eight) hours as needed for pain. 12/12/12   Lawyer, Lonni, PA-C  methocarbamol  (ROBAXIN ) 500 MG tablet Take 2 tablets (1,000 mg total) by mouth every 8 (eight) hours as needed for muscle spasms. 01/12/17   Armenta Canning, MD  naproxen  (NAPROSYN ) 500 MG tablet Take 1 tablet (500 mg total) by mouth 2 (two) times daily. 01/12/17   Armenta Canning, MD    Family History Family History  Problem Relation Age of Onset   Hypertension Father     Social History Social History   Tobacco  Use   Smoking status: Former  Building services engineer status: Never Used  Substance Use Topics   Alcohol use: Never   Drug use: Never     Allergies   Patient has no known allergies.   Review of Systems Review of Systems  Constitutional:  Positive for appetite change. Negative for chills and fever.  HENT:  Negative for congestion, rhinorrhea and sneezing.   Respiratory:  Negative for cough, shortness of breath and wheezing.   Cardiovascular:  Negative for chest pain.  Gastrointestinal:  Positive for nausea and vomiting.  Genitourinary:  Positive for frequency. Negative for dysuria, flank pain and hematuria.  Musculoskeletal:  Positive for back pain (low lumbar pain) and myalgias (legs and arms).     Physical Exam Triage Vital Signs ED Triage Vitals  Encounter Vitals Group     BP 01/12/24 0938 130/87     Girls Systolic BP Percentile --      Girls Diastolic BP Percentile --      Boys Systolic BP Percentile --      Boys Diastolic BP Percentile --      Pulse Rate 01/12/24 0938 (!) 122     Resp 01/12/24 0938 19     Temp 01/12/24 0938 98.1 F (36.7 C)     Temp Source 01/12/24 0938 Oral  SpO2 01/12/24 0938 97 %     Weight 01/12/24 0936 270 lb (122.5 kg)     Height 01/12/24 0936 6' (1.829 m)     Head Circumference --      Peak Flow --      Pain Score 01/12/24 0935 6     Pain Loc --      Pain Education --      Exclude from Growth Chart --    No data found.  Updated Vital Signs BP 130/87 (BP Location: Left Arm)   Pulse (!) 122   Temp 98.1 F (36.7 C) (Oral)   Resp 19   Ht 6' (1.829 m)   Wt 270 lb (122.5 kg)   SpO2 97%   BMI 36.62 kg/m   Visual Acuity Right Eye Distance:   Left Eye Distance:   Bilateral Distance:    Right Eye Near:   Left Eye Near:    Bilateral Near:     Physical Exam Vitals reviewed.  Constitutional:      General: He is awake. He is not in acute distress.    Appearance: Normal appearance. He is well-developed and well-groomed. He is  not ill-appearing or toxic-appearing.  HENT:     Head: Normocephalic and atraumatic.     Jaw: No trismus, tenderness or swelling.     Salivary Glands: Right salivary gland is not diffusely enlarged or tender. Left salivary gland is not diffusely enlarged or tender.     Mouth/Throat:     Lips: Pink.     Mouth: Mucous membranes are moist. No oral lesions.     Pharynx: Oropharynx is clear. Uvula midline. No pharyngeal swelling, oropharyngeal exudate, posterior oropharyngeal erythema, uvula swelling or postnasal drip.     Tonsils: No tonsillar exudate or tonsillar abscesses.  Eyes:     Extraocular Movements: Extraocular movements intact.     Conjunctiva/sclera: Conjunctivae normal.  Cardiovascular:     Rate and Rhythm: Regular rhythm. Tachycardia present.     Heart sounds: Normal heart sounds. No murmur heard.    No friction rub. No gallop.  Pulmonary:     Effort: Pulmonary effort is normal.     Breath sounds: Normal breath sounds. No decreased air movement. No decreased breath sounds, wheezing, rhonchi or rales.  Abdominal:     General: Abdomen is flat.     Palpations: Abdomen is soft.     Tenderness: There is no abdominal tenderness. There is no right CVA tenderness or left CVA tenderness.  Musculoskeletal:     Cervical back: Normal range of motion and neck supple.  Skin:    General: Skin is warm and dry.  Neurological:     General: No focal deficit present.     Mental Status: He is alert and oriented to person, place, and time.     GCS: GCS eye subscore is 4. GCS verbal subscore is 5. GCS motor subscore is 6.     Cranial Nerves: Cranial nerves 2-12 are intact.     Motor: Motor function is intact. No weakness, tremor or abnormal muscle tone.  Psychiatric:        Attention and Perception: Attention and perception normal.        Mood and Affect: Mood and affect normal.        Speech: Speech normal.        Behavior: Behavior normal. Behavior is cooperative.      UC Treatments /  Results  Labs (all labs ordered are listed, but only  abnormal results are displayed) Labs Reviewed  POCT URINE DIPSTICK - Abnormal; Notable for the following components:      Result Value   Glucose, UA =500 (*)    Ketones, POC UA >= (160) (*)    Blood, UA trace-intact (*)    All other components within normal limits  GLUCOSE, POCT (MANUAL RESULT ENTRY) - Abnormal; Notable for the following components:   POCT Glucose ([facility]) 600 (*)    All other components within normal limits  POC SOFIA SARS ANTIGEN FIA    EKG   Radiology No results found.  Procedures Procedures (including critical care time)  Medications Ordered in UC Medications  ondansetron  (ZOFRAN -ODT) disintegrating tablet 4 mg (4 mg Oral Given 01/12/24 1021)    Initial Impression / Assessment and Plan / UC Course  I have reviewed the triage vital signs and the nursing notes.  Pertinent labs & imaging results that were available during my care of the patient were reviewed by me and considered in my medical decision making (see chart for details).      Final Clinical Impressions(s) / UC Diagnoses   Final diagnoses:  Urinary frequency  Productive cough  Glucosuria   Patient presents today with concerns for productive cough of thick, white mucus, persistent nausea and decreased appetite, myalgias in the extremities, increased urinary frequency has been ongoing for the past 3 to 4 days.  Physical exam reveals significant tachycardia and patient skin appears mildly dry.  Oral mucosa is moist and there are no signs of pharyngeal erythema, oral lesions or plaques.  Urine dip was notable for significant glucosuria and ketonuria along with trace intact blood.  CBG was greater than 600 and we were not able to have a definitive measure for this due to elevation.  COVID testing is negative.  Based on patient's symptoms I am concerned that he may be in [facility] or DKA.  Initially I was going to order blood work but after CBG I am  concerned that he may need emergency room evaluation and stabilization. I discussed with patient that in the emergency room he would have Quicker lab results and IV medications for stabilization.  He is amenable to going to the ED and reports that he does have somebody with him that can drive him.  Patient was provided with nearby emergency room addresses as well as wait times.  Recommend going to [facility] or [facility] for evaluation.  Patient declines EMS stating that he will go via private vehicle    Discharge Instructions      You were seen today for concerns of generalized bodyaches, productive coughing and urinary frequency. Your urine had a significant amount of glucose or sugar in it which makes me concerned that you may have a high blood glucose. Your finger stick blood glucose level was over 600 and we are not able to provide a definitive number due to this elevation. Based on how you are feeling and your high blood glucose levels I am concerned that you are experiencing complications from undiagnosed Diabetes. However I cannot rule out other causes of your symptoms. Glucose levels this elevated with nausea and vomiting can precipitate emergency conditions and I recommend that you go to the ED for further evaluation, stabilization and management. Please have another person drive you there as it may not be safe for you to drive right now. If you have any complications on the way, please call 911 for assistance.      ED Prescriptions  None    PDMP not reviewed this encounter.   Marylene Rocky BRAVO, PA-C 01/12/24 1047

## 2024-01-12 NOTE — ED Notes (Signed)
 This RN suggested to [place] PA that we obtain a CBG. See orders.

## 2024-01-12 NOTE — ED Triage Notes (Signed)
 Pt presented to UC this morning with a week long hx of increased thirst, increased urination, and nausea.  Pt's CBG there was greater than 600. No hx of diabetes.  SEnt for eval

## 2024-01-12 NOTE — Plan of Care (Signed)
  Problem: Fluid Volume: Goal: Ability to maintain a balanced intake and output will improve Outcome: Progressing   Problem: Cardiac: Goal: Ability to maintain an adequate cardiac output will improve Outcome: Progressing   Problem: Health Behavior/Discharge Planning: Goal: Ability to manage health-related needs will improve Outcome: Progressing   Problem: Fluid Volume: Goal: Ability to achieve a balanced intake and output will improve Outcome: Progressing   Problem: Respiratory: Goal: Will regain and/or maintain adequate ventilation Outcome: Progressing   Problem: Urinary Elimination: Goal: Ability to achieve and maintain adequate renal perfusion and functioning will improve Outcome: Progressing

## 2024-01-12 NOTE — ED Triage Notes (Signed)
 Pt presents with a chief complaint of nausea x 3-4 days. States he is also experiencing thick sputum coming from mouth, having to urinate every two hours, and generalized soreness. Currently rates overall pain a 6/10. One occurrence of vomiting yesterday. States it was after taking a huge pill (Mucinex). OTC Ibuprofen  taken with relief in soreness.

## 2024-01-12 NOTE — ED Notes (Signed)
 Patient is being discharged from the Urgent Care and sent to the Emergency Department via private vehicle with spouse . Per Rocky Mecum PA, patient is in need of higher level of care due to elevated blood sugar levels, nausea, and urinary frequency. Patient is aware and verbalizes understanding of plan of care.   Vitals:   01/12/24 0938  BP: 130/87  Pulse: (!) 122  Resp: 19  Temp: 98.1 F (36.7 C)  SpO2: 97%

## 2024-01-12 NOTE — H&P (Signed)
 History and Physical    PatientPascual Mccoy FMW:[id number] DOB: [birth year] DOA: 01/12/2024 DOS: the patient was seen and examined on 01/12/2024 PCP: Patient, No Pcp Per  Patient coming from: Home  Chief Complaint:  Chief Complaint  Patient presents with   Hyperglycemia   HPI: Carlos Mccoy is a 43 y.o. male with medical history significant of class II obesity who presented to the emergency department with several days of generalized fatigue, body aches, blurred vision, polyuria, polydipsia and nausea for several days. He had an episode of emesis yesterday after he took a guaifenesin tablet. He also took ibuprofen  which relieved his muscle soreness. No prior medical history according to the patient. His parents and maternal grandmother have a history of diabetes. His father also has hypertension. He has no prior surgical history. He denied fever, chills, rhinorrhea, sore throat, wheezing or hemoptysis. No chest pain, palpitations, diaphoresis, PND, orthopnea or pitting edema of the lower extremities. No abdominal pain, diarrhea, constipation, melena or hematochezia.  No flank pain, dysuria, frequency or hematuria.    Lab work: CBC showed a white count of 12.0, hemoglobin 18.2 g/dL and platelets 637.  Total CK was normal.  Venous blood gas showed a pH of 7.29, PCO2 of 41 and PO2 of less than 31 mmHg, bicarbonate 19.7 and acid-base deficit 6.5 mmol/L.  CMP showed sodium 126 (corrected 138), potassium 4.7, chloride 86 and CO2 17 mmol/L with an anion gap of 23.  Glucose 192, BUN 28, creatinine 1.54, calcium 10.3 (corrected 9.4) and total bilirubin 3.0 mg/dL.  Total protein 9.3 and albumin 5.1 g/dL, normal transaminases, alkaline phosphatase 137 units/L.   ED course: Initial vital signs were temperature 98 F, pulse 101, respiration 16, BP 145/99 mmHg O2 sat 99% on room air.  The patient received LR 2500 mL bolus and was started on an insulin  infusion.  Review of Systems: As mentioned in the  history of present illness. All other systems reviewed and are negative. No past medical history on file. No past surgical history on file. Social History:  reports that he has quit smoking. He does not have any smokeless tobacco history on file. He reports that he does not drink alcohol and does not use drugs.  No Known Allergies  Family History  Problem Relation Age of Onset   Hypertension Father     Prior to Admission medications   Medication Sig Start Date End Date Taking? Authorizing Provider  acetaminophen  (TYLENOL ) 500 MG tablet Take 500 mg by mouth every 6 (six) hours as needed for pain (pain).    [provider]  HYDROcodone -acetaminophen  (NORCO/VICODIN) 5-325 MG per tablet Take 1 tablet by mouth every 6 (six) hours as needed for pain. 12/12/12   Lawyer, Lonni, PA-C  ibuprofen  (ADVIL ,MOTRIN ) 800 MG tablet Take 1 tablet (800 mg total) by mouth every 8 (eight) hours as needed for pain. 12/12/12   Lawyer, Lonni, PA-C  methocarbamol  (ROBAXIN ) 500 MG tablet Take 2 tablets (1,000 mg total) by mouth every 8 (eight) hours as needed for muscle spasms. 01/12/17   Armenta Canning, MD  naproxen  (NAPROSYN ) 500 MG tablet Take 1 tablet (500 mg total) by mouth 2 (two) times daily. 01/12/17   Armenta Canning, MD    Physical Exam: Vitals:   01/12/24 1115 01/12/24 1116 01/12/24 1215  BP: (!) 145/99  (!) 137/112  Pulse: (!) 101  (!) 111  Resp:  16 19  Temp:  98 F (36.7 C)   SpO2: 99%  97%   Physical Exam  Vitals reviewed.  Constitutional:      General: He is awake. He is not in acute distress.    Appearance: He is obese. He is ill-appearing.  HENT:     Head: Normocephalic.     Nose: No rhinorrhea.     Mouth/Throat:     Mouth: Mucous membranes are dry.  Eyes:     General: No scleral icterus.    Pupils: Pupils are equal, round, and reactive to light.  Neck:     Vascular: No JVD.  Cardiovascular:     Rate and Rhythm: Normal rate and regular rhythm.     Heart sounds:  S1 normal and S2 normal.  Pulmonary:     Effort: Pulmonary effort is normal.     Breath sounds: Normal breath sounds. No wheezing, rhonchi or rales.  Abdominal:     General: Abdomen is protuberant. Bowel sounds are normal. There is no distension.     Palpations: Abdomen is soft.     Tenderness: There is no abdominal tenderness. There is no right CVA tenderness or left CVA tenderness.  Musculoskeletal:     Cervical back: Neck supple.     Right lower leg: No edema.     Left lower leg: No edema.  Skin:    General: Skin is warm and dry.  Neurological:     General: No focal deficit present.     Mental Status: He is alert and oriented to person, place, and time.  Psychiatric:        Mood and Affect: Mood normal.        Behavior: Behavior normal. Behavior is cooperative.     Data Reviewed:  Results are pending, will review when available.  EKG: Vent. rate 114 BPM PR interval 145 ms QRS duration 82 ms QT/QTcB 341/470 ms P-R-T axes 80 63 9 Sinus tachycardia nonspecific inferior T wave  Assessment and Plan: Principal Problem:   DKA (diabetic ketoacidosis) (HCC) Newly diagnosed diabetes. Observation/stepdown. Keep NPO. Continue IV fluids. Continue insulin  infusion. Monitor CBG closely. BMP every 4 hours. BHA every 8 hours. Replace electrolytes as needed. Consult diabetes coordinator. Transition to SQ insulin  per Endo tool. He will need to establish with a primary care provider. Advised to have yearly retina check and microalbuminuria. Advised to avoid walking barefoot. Advised to check his feet daily.  Active Problems:   Class 2 obesity Current BMI 36.62 kg/m. Would benefit from lifestyle modifications. Follow-up closely with PCP once establish.    Elevated serum creatinine No history of kidney disease. Continue IV fluids. Monitor intake and output. Follow creatinine level with next BMP.    Elevated blood pressure reading Associated with:   Sinus tachycardia   Will use labetalol  as needed.    Hyperbilirubinemia In the setting of volume depletion. Normal transaminases. Will recheck in AM. If elevated, will check RUQ ultrasound Further workup depending on results.    Hyperproteinemia Secondary to hemoconcentration. Continue IV fluids.    Pseudohyponatremia In the setting of hyperglycemia.    Leukocytosis No fever or signs of infection. Follow WBC in the morning.    Erythrocytosis Secondary to dehydration. Continue IV hydration and recheck in AM.    Advance Care Planning:   Code Status: Full Code   Consults:   Family Communication: His wife was at bedside.  Severity of Illness: The appropriate patient status for this patient is OBSERVATION. Observation status is judged to be reasonable and necessary in order to provide the required intensity of service to ensure the patient's  safety. The patient's presenting symptoms, physical exam findings, and initial radiographic and laboratory data in the context of their medical condition is felt to place them at decreased risk for further clinical deterioration. Furthermore, it is anticipated that the patient will be medically stable for discharge from the hospital within 2 midnights of admission.   Author: Alm Dorn Castor, MD 01/12/2024 1:20 PM  For on call review [email or web address].   This document was prepared using Dragon voice recognition software and may contain some unintended transcription errors.

## 2024-01-13 DIAGNOSIS — Z8249 Family history of ischemic heart disease and other diseases of the circulatory system: Secondary | ICD-10-CM | POA: Diagnosis not present

## 2024-01-13 DIAGNOSIS — N179 Acute kidney failure, unspecified: Secondary | ICD-10-CM | POA: Diagnosis present

## 2024-01-13 DIAGNOSIS — D751 Secondary polycythemia: Secondary | ICD-10-CM | POA: Diagnosis present

## 2024-01-13 DIAGNOSIS — R350 Frequency of micturition: Secondary | ICD-10-CM | POA: Diagnosis present

## 2024-01-13 DIAGNOSIS — R000 Tachycardia, unspecified: Secondary | ICD-10-CM | POA: Diagnosis present

## 2024-01-13 DIAGNOSIS — E8809 Other disorders of plasma-protein metabolism, not elsewhere classified: Secondary | ICD-10-CM | POA: Diagnosis present

## 2024-01-13 DIAGNOSIS — Z87891 Personal history of nicotine dependence: Secondary | ICD-10-CM | POA: Diagnosis not present

## 2024-01-13 DIAGNOSIS — I10 Essential (primary) hypertension: Secondary | ICD-10-CM | POA: Diagnosis present

## 2024-01-13 DIAGNOSIS — D72829 Elevated white blood cell count, unspecified: Secondary | ICD-10-CM | POA: Diagnosis present

## 2024-01-13 DIAGNOSIS — E66812 Obesity, class 2: Secondary | ICD-10-CM | POA: Diagnosis present

## 2024-01-13 DIAGNOSIS — E1110 Type 2 diabetes mellitus with ketoacidosis without coma: Secondary | ICD-10-CM | POA: Diagnosis present

## 2024-01-13 DIAGNOSIS — Z6836 Body mass index (BMI) 36.0-36.9, adult: Secondary | ICD-10-CM | POA: Diagnosis not present

## 2024-01-13 DIAGNOSIS — E871 Hypo-osmolality and hyponatremia: Secondary | ICD-10-CM | POA: Diagnosis present

## 2024-01-13 DIAGNOSIS — Z833 Family history of diabetes mellitus: Secondary | ICD-10-CM | POA: Diagnosis not present

## 2024-01-13 DIAGNOSIS — E860 Dehydration: Secondary | ICD-10-CM | POA: Diagnosis present

## 2024-01-13 LAB — URINALYSIS, ROUTINE W REFLEX MICROSCOPIC
Bacteria, UA: NONE SEEN
Bilirubin Urine: NEGATIVE
Glucose, UA: NEGATIVE mg/dL
Hgb urine dipstick: NEGATIVE
Ketones, ur: 20 mg/dL — AB
Leukocytes,Ua: NEGATIVE
Nitrite: NEGATIVE
Protein, ur: NEGATIVE mg/dL
Specific Gravity, Urine: 1.029 (ref 1.005–1.030)
pH: 5 (ref 5.0–8.0)

## 2024-01-13 LAB — GLUCOSE, CAPILLARY
Glucose-Capillary: 170 mg/dL — ABNORMAL HIGH (ref 70–99)
Glucose-Capillary: 170 mg/dL — ABNORMAL HIGH (ref 70–99)
Glucose-Capillary: 177 mg/dL — ABNORMAL HIGH (ref 70–99)
Glucose-Capillary: 179 mg/dL — ABNORMAL HIGH (ref 70–99)
Glucose-Capillary: 181 mg/dL — ABNORMAL HIGH (ref 70–99)
Glucose-Capillary: 183 mg/dL — ABNORMAL HIGH (ref 70–99)
Glucose-Capillary: 198 mg/dL — ABNORMAL HIGH (ref 70–99)
Glucose-Capillary: 199 mg/dL — ABNORMAL HIGH (ref 70–99)
Glucose-Capillary: 202 mg/dL — ABNORMAL HIGH (ref 70–99)
Glucose-Capillary: 205 mg/dL — ABNORMAL HIGH (ref 70–99)
Glucose-Capillary: 208 mg/dL — ABNORMAL HIGH (ref 70–99)
Glucose-Capillary: 208 mg/dL — ABNORMAL HIGH (ref 70–99)
Glucose-Capillary: 212 mg/dL — ABNORMAL HIGH (ref 70–99)
Glucose-Capillary: 332 mg/dL — ABNORMAL HIGH (ref 70–99)

## 2024-01-13 LAB — HEPATIC FUNCTION PANEL
ALT: 24 U/L (ref 0–44)
AST: 20 U/L (ref 15–41)
Albumin: 3.8 g/dL (ref 3.5–5.0)
Alkaline Phosphatase: 89 U/L (ref 38–126)
Bilirubin, Direct: 0.2 mg/dL (ref 0.0–0.2)
Indirect Bilirubin: 2.5 mg/dL — ABNORMAL HIGH (ref 0.3–0.9)
Total Bilirubin: 2.7 mg/dL — ABNORMAL HIGH (ref 0.0–1.2)
Total Protein: 6.7 g/dL (ref 6.5–8.1)

## 2024-01-13 LAB — BASIC METABOLIC PANEL WITH GFR
Anion gap: 14 (ref 5–15)
Anion gap: 16 — ABNORMAL HIGH (ref 5–15)
BUN: 15 mg/dL (ref 6–20)
BUN: 17 mg/dL (ref 6–20)
CO2: 23 mmol/L (ref 22–32)
CO2: 24 mmol/L (ref 22–32)
Calcium: 9.5 mg/dL (ref 8.9–10.3)
Calcium: 9.6 mg/dL (ref 8.9–10.3)
Chloride: 94 mmol/L — ABNORMAL LOW (ref 98–111)
Chloride: 98 mmol/L (ref 98–111)
Creatinine, Ser: 0.98 mg/dL (ref 0.61–1.24)
Creatinine, Ser: 1.1 mg/dL (ref 0.61–1.24)
GFR, Estimated: >60 mL/min (ref >60)
GFR, Estimated: >60 mL/min (ref >60)
Glucose, Bld: 182 mg/dL — ABNORMAL HIGH (ref 70–99)
Glucose, Bld: 186 mg/dL — ABNORMAL HIGH (ref 70–99)
Potassium: 3.1 mmol/L — ABNORMAL LOW (ref 3.5–5.1)
Potassium: 3.4 mmol/L — ABNORMAL LOW (ref 3.5–5.1)
Sodium: 133 mmol/L — ABNORMAL LOW (ref 135–145)
Sodium: 136 mmol/L (ref 135–145)

## 2024-01-13 LAB — CBC
HCT: 42.2 % (ref 39.0–52.0)
Hemoglobin: 15.6 g/dL (ref 13.0–17.0)
MCH: 31.5 pg (ref 26.0–34.0)
MCHC: 37 g/dL — ABNORMAL HIGH (ref 30.0–36.0)
MCV: 85.3 fL (ref 80.0–100.0)
Platelets: 297 K/uL (ref 150–400)
RBC: 4.95 MIL/uL (ref 4.22–5.81)
RDW: 11.3 % — ABNORMAL LOW (ref 11.5–15.5)
WBC: 10.3 K/uL (ref 4.0–10.5)
nRBC: 0 % (ref 0.0–0.2)

## 2024-01-13 LAB — HIV ANTIBODY (ROUTINE TESTING W REFLEX): HIV Screen 4th Generation wRfx: NONREACTIVE

## 2024-01-13 LAB — COMPREHENSIVE METABOLIC PANEL WITH GFR
ALT: 23 U/L (ref 0–44)
AST: 17 U/L (ref 15–41)
Albumin: 4 g/dL (ref 3.5–5.0)
Alkaline Phosphatase: 89 U/L (ref 38–126)
Anion gap: 14 (ref 5–15)
BUN: 16 mg/dL (ref 6–20)
CO2: 22 mmol/L (ref 22–32)
Calcium: 9.3 mg/dL (ref 8.9–10.3)
Chloride: 97 mmol/L — ABNORMAL LOW (ref 98–111)
Creatinine, Ser: 1 mg/dL (ref 0.61–1.24)
GFR, Estimated: >60 mL/min (ref >60)
Glucose, Bld: 169 mg/dL — ABNORMAL HIGH (ref 70–99)
Potassium: 3.1 mmol/L — ABNORMAL LOW (ref 3.5–5.1)
Sodium: 133 mmol/L — ABNORMAL LOW (ref 135–145)
Total Bilirubin: 2.9 mg/dL — ABNORMAL HIGH (ref 0.0–1.2)
Total Protein: 6.8 g/dL (ref 6.5–8.1)

## 2024-01-13 LAB — BETA-HYDROXYBUTYRIC ACID
Beta-Hydroxybutyric Acid: 1.84 mmol/L — ABNORMAL HIGH (ref 0.05–0.27)
Beta-Hydroxybutyric Acid: 1.79 mmol/L — ABNORMAL HIGH (ref 0.05–0.27)
Beta-Hydroxybutyric Acid: 2.1 mmol/L — ABNORMAL HIGH (ref 0.05–0.27)
Beta-Hydroxybutyric Acid: 2.14 mmol/L — ABNORMAL HIGH (ref 0.05–0.27)

## 2024-01-13 MED ORDER — METOPROLOL SUCCINATE ER 25 MG PO TB24
25.0000 mg | ORAL_TABLET | Freq: Every day | ORAL | Status: DC
  Administered 2024-01-13 – 2024-01-15 (×5): 25 mg via ORAL
  Filled 2024-01-13 (×3): qty 1

## 2024-01-13 MED ORDER — LACTATED RINGERS IV SOLN: INTRAVENOUS | Status: DC

## 2024-01-13 MED ORDER — INSULIN GLARGINE-YFGN 100 UNIT/ML ~~LOC~~ SOLN
18.0000 [IU] | Freq: Every day | SUBCUTANEOUS | Status: DC
  Administered 2024-01-13: 18 [IU] via SUBCUTANEOUS
  Filled 2024-01-13 (×3): qty 0.18

## 2024-01-13 MED ORDER — LIVING WELL WITH DIABETES BOOK
Freq: Once | Status: AC
  Filled 2024-01-13: qty 1

## 2024-01-13 MED ORDER — INSULIN GLARGINE-YFGN 100 UNIT/ML ~~LOC~~ SOPN: 18.0000 [IU] | PEN_INJECTOR | Freq: Every day | SUBCUTANEOUS | Status: DC

## 2024-01-13 MED ORDER — POTASSIUM CHLORIDE CRYS ER 20 MEQ PO TBCR
40.0000 meq | EXTENDED_RELEASE_TABLET | Freq: Once | ORAL | Status: AC
  Administered 2024-01-13: 40 meq via ORAL
  Filled 2024-01-13: qty 2

## 2024-01-13 MED ORDER — INSULIN ASPART 100 UNIT/ML IJ SOLN
2.0000 [IU] | Freq: Three times a day (TID) | INTRAMUSCULAR | Status: DC
  Administered 2024-01-13: 2 [IU] via SUBCUTANEOUS

## 2024-01-13 MED ORDER — INSULIN ASPART 100 UNIT/ML IJ SOLN
0.0000 [IU] | Freq: Every day | INTRAMUSCULAR | Status: DC
  Administered 2024-01-13 – 2024-01-14 (×3): 2 [IU] via SUBCUTANEOUS

## 2024-01-13 MED ORDER — INSULIN ASPART 100 UNIT/ML IJ SOLN
0.0000 [IU] | Freq: Three times a day (TID) | INTRAMUSCULAR | Status: DC
  Administered 2024-01-13: 11 [IU] via SUBCUTANEOUS
  Administered 2024-01-13: 3 [IU] via SUBCUTANEOUS
  Administered 2024-01-14: 11 [IU] via SUBCUTANEOUS
  Administered 2024-01-14 (×4): 8 [IU] via SUBCUTANEOUS
  Administered 2024-01-14: 11 [IU] via SUBCUTANEOUS
  Administered 2024-01-15: 8 [IU] via SUBCUTANEOUS
  Administered 2024-01-15 (×2): 5 [IU] via SUBCUTANEOUS
  Administered 2024-01-15: 8 [IU] via SUBCUTANEOUS

## 2024-01-13 MED ORDER — POTASSIUM CHLORIDE CRYS ER 20 MEQ PO TBCR
20.0000 meq | EXTENDED_RELEASE_TABLET | Freq: Once | ORAL | Status: AC
  Administered 2024-01-13: 20 meq via ORAL
  Filled 2024-01-13: qty 1

## 2024-01-13 NOTE — Plan of Care (Signed)
  Problem: Education: Goal: Ability to describe self-care measures that may prevent or decrease complications (Diabetes Survival Skills Education) will improve Outcome: Progressing   Problem: Coping: Goal: Ability to adjust to condition or change in health will improve Outcome: Progressing   Problem: Fluid Volume: Goal: Ability to maintain a balanced intake and output will improve Outcome: Progressing   Problem: Health Behavior/Discharge Planning: Goal: Ability to identify and utilize available resources and services will improve Outcome: Progressing Goal: Ability to manage health-related needs will improve Outcome: Progressing   Problem: Metabolic: Goal: Ability to maintain appropriate glucose levels will improve Outcome: Progressing   Problem: Cardiac: Goal: Ability to maintain an adequate cardiac output will improve Outcome: Progressing   Problem: Metabolic: Goal: Ability to maintain appropriate glucose levels will improve Outcome: Progressing

## 2024-01-13 NOTE — Progress Notes (Signed)
 PROGRESS NOTE    Carlos Mccoy  FMW:[id number] DOB: [birth year] DOA: 01/12/2024 PCP: Patient, No Pcp Per   Brief Narrative: 43 year old with past medical history significant for class II obesity presents with several days of generalized fatigue, body ache, blurry vision, polyuria polydipsia.  Episode associated of emesis.  Evaluation in the ED consistent with , pH 7.2, bicarb 18, sodium 136, anion gap 23, glucose 637, creatinine 1.5.   Assessment & Plan:   Principal Problem:   DKA (diabetic ketoacidosis) (HCC) Active Problems:   Class 2 obesity   Elevated serum creatinine   Elevated blood pressure reading   Hyperbilirubinemia   Hyperproteinemia   Erythrocytosis   Pseudohyponatremia   Leukocytosis   Sinus tachycardia  1-new diagnosis of diabetes, DKA Patient presented with hyperglycemia CBG 600, anion gap 23, bicarb 17.  Beta butyrate acid 5.9, pH 7.2 -New diagnosis of diabetes will need to be discharged on insulin  -A1c pending -Currently on IV fluids and insulin  drip. -Plan to transition to long-acting insulin  and sliding scale when gap Class II obesity   AKI - Presents with a creatinine of 1.5, in the setting of dehydration and DKA. - Improved with IV fluids, creatinine down to 1.0   Hypertension: Systolic blood pressure 180s--- 150s He will need to be started on medications to control blood pressure Start Metoprolol .  Hyperbilirubinemia - In the setting of dehydration follow trend  Hyperproteinemia -Monitor   Pseudohyponatremia, and hyponatremia - In the setting of hyperglycemia and DKA, also in the setting of dehydration - Improving, with fluids  Leukocytosis In the setting of demargination Mellitus Normalized.   Hemoconcentration The setting of dehydration Normalized  Estimated body mass index is 32.37 kg/m as calculated from the following:   Height as of this encounter: 6' 1 (1.854 m).   Weight as of this encounter: 111.3 kg.   DVT  prophylaxis: Lovenox  Code Status: Full code Family Communication: Care discussed with patient  Disposition Plan:  Status is: Observation The patient remains OBS appropriate and will d/c before 2 midnights.    Consultants:  None  Procedures:  None  Antimicrobials:    Subjective: He is alert, denies pain.   Objective: Vitals:   01/13/24 0300 01/13/24 0334 01/13/24 0400 01/13/24 0500  BP: (!) 130/92  (!) 138/92 (!) 158/102  Pulse: 99  (!) 106 (!) 101  Resp: (!) 21  (!) 21 14  Temp:  98.6 F (37 C)    TempSrc:  Oral    SpO2: 91%  95% 98%  Weight:      Height:        Intake/Output Summary (Last 24 hours) at 01/13/2024 0718 Last data filed at 01/12/2024 2333 Gross per 24 hour  Intake 3675.85 ml  Output --  Net 3675.85 ml   Filed Weights   01/12/24 1515  Weight: 111.3 kg    Examination:  General exam: Appears calm and comfortable  Respiratory system: Clear to auscultation. Respiratory effort normal. Cardiovascular system: S1 & S2 heard, RRR. No JVD, murmurs, rubs, gallops or clicks. No pedal edema. Gastrointestinal system: Abdomen is nondistended, soft and nontender. No organomegaly or masses felt. Normal bowel sounds heard. Central nervous system: Alert and oriented. No focal neurological deficits. Extremities: Symmetric 5 x 5 power. Skin: No rashes, lesions or ulcers Psychiatry: Judgement and insight appear normal. Mood & affect appropriate.     Data Reviewed: I have personally reviewed following labs and imaging studies  CBC: Recent Labs  Lab 01/12/24 1138 01/12/24 1142 01/13/24 0244  WBC  --  12.0* 10.3  HGB 18.7* 18.2* 15.6  HCT 55.0* 50.8 42.2  MCV  --  85.2 85.3  PLT  --  362 297   Basic Metabolic Panel: Recent Labs  Lab 01/12/24 1138 01/12/24 1142 01/12/24 1542 01/13/24 0002 01/13/24 0244  NA 126* 126* 132* 133* 133*  K 4.8 4.7 4.3 3.1* 3.1*  CL 91* 86* 94* 94* 97*  CO2  --  17* 21* 23 22  GLUCOSE 637* 592* 410* 182* 169*  BUN 21*  20 20 17 16   CREATININE 1.40* 1.54* 1.34* 1.10 1.00  CALCIUM  --  10.3 10.1 9.5 9.3  MG  --   --  2.3  --   --   PHOS  --   --  4.1  --   --    GFR: Estimated Creatinine Clearance: 127.2 mL/min (by C-G formula based on SCr of 1 mg/dL). Liver Function Tests: Recent Labs  Lab 01/12/24 1142 01/13/24 0244  AST 19 17  ALT 31 23  ALKPHOS 137* 89  BILITOT 3.0* 2.9*  PROT 9.3* 6.8  ALBUMIN 5.1* 4.0   No results for input(s): LIPASE, AMYLASE in the last 168 hours. No results for input(s): AMMONIA in the last 168 hours. Coagulation Profile: No results for input(s): INR, PROTIME in the last 168 hours. Cardiac Enzymes: Recent Labs  Lab 01/12/24 1142  CKTOTAL 121   BNP (last 3 results) No results for input(s): PROBNP in the last 8760 hours. HbA1C: No results for input(s): HGBA1C in the last 72 hours. CBG: Recent Labs  Lab 01/13/24 0230 01/13/24 0333 01/13/24 0432 01/13/24 0530 01/13/24 0631  GLUCAP 179* 212* 170* 205* 183*   Lipid Profile: Recent Labs    01/12/24 1541  CHOL 212*  HDL 29*  LDLCALC 133*  TRIG 251*  CHOLHDL 7.3   Thyroid Function Tests: No results for input(s): TSH, T4TOTAL, FREET4, T3FREE, THYROIDAB in the last 72 hours. Anemia Panel: No results for input(s): VITAMINB12, FOLATE, FERRITIN, TIBC, IRON, RETICCTPCT in the last 72 hours. Sepsis Labs: No results for input(s): PROCALCITON, LATICACIDVEN in the last 168 hours.  Recent Results (from the past 240 hours)  MRSA Next Gen by PCR, Nasal     Status: None   Collection Time: 01/12/24  5:34 PM   Specimen: Nasal Mucosa; Nasal Swab  Result Value Ref Range Status   MRSA by PCR Next Gen NOT DETECTED NOT DETECTED Final    Comment: (NOTE) The GeneXpert MRSA Assay (FDA approved for NASAL specimens only), is one component of a comprehensive MRSA colonization surveillance program. It is not intended to diagnose MRSA infection nor to guide or monitor treatment for  MRSA infections. Test performance is not FDA approved in patients less than 18 years old. Performed at [facility], [address] [place], [place] [address]          Radiology Studies: No results found.      Scheduled Meds:  Chlorhexidine  Gluconate Cloth  6 each Topical Q0600   enoxaparin  (LOVENOX ) injection  60 mg Subcutaneous Q24H   Continuous Infusions:  dextrose  5% lactated ringers  125 mL/hr at 01/13/24 0431   insulin  6 Units/hr (01/13/24 0430)   lactated ringers  Stopped (01/12/24 1922)     LOS: 0 days    Time spent: 35 Minutes    Nare Gaspari A Gradie Ohm, MD [facility]   If 7PM-7AM, please contact night-coverage [email or web address]  01/13/2024, 7:18 AM

## 2024-01-14 ENCOUNTER — Telehealth (HOSPITAL_COMMUNITY): Payer: Self-pay | Admitting: Pharmacy Technician

## 2024-01-14 ENCOUNTER — Other Ambulatory Visit (HOSPITAL_COMMUNITY): Payer: Self-pay

## 2024-01-14 DIAGNOSIS — E1110 Type 2 diabetes mellitus with ketoacidosis without coma: Secondary | ICD-10-CM | POA: Diagnosis not present

## 2024-01-14 LAB — COMPREHENSIVE METABOLIC PANEL WITH GFR
ALT: 27 U/L (ref 0–44)
AST: 25 U/L (ref 15–41)
Albumin: 3.5 g/dL (ref 3.5–5.0)
Alkaline Phosphatase: 90 U/L (ref 38–126)
Anion gap: 13 (ref 5–15)
BUN: 18 mg/dL (ref 6–20)
CO2: 21 mmol/L — ABNORMAL LOW (ref 22–32)
Calcium: 9.1 mg/dL (ref 8.9–10.3)
Chloride: 100 mmol/L (ref 98–111)
Creatinine, Ser: 0.99 mg/dL (ref 0.61–1.24)
GFR, Estimated: >60 mL/min (ref >60)
Glucose, Bld: 275 mg/dL — ABNORMAL HIGH (ref 70–99)
Potassium: 3.6 mmol/L (ref 3.5–5.1)
Sodium: 134 mmol/L — ABNORMAL LOW (ref 135–145)
Total Bilirubin: 2 mg/dL — ABNORMAL HIGH (ref 0.0–1.2)
Total Protein: 6.1 g/dL — ABNORMAL LOW (ref 6.5–8.1)

## 2024-01-14 LAB — HEMOGLOBIN A1C
Hgb A1c MFr Bld: 9.9 % — ABNORMAL HIGH (ref 4.8–5.6)
Mean Plasma Glucose: 237 mg/dL

## 2024-01-14 LAB — GLUCOSE, CAPILLARY
Glucose-Capillary: 332 mg/dL — ABNORMAL HIGH (ref 70–99)
Glucose-Capillary: 290 mg/dL — ABNORMAL HIGH (ref 70–99)
Glucose-Capillary: 255 mg/dL — ABNORMAL HIGH (ref 70–99)
Glucose-Capillary: 215 mg/dL — ABNORMAL HIGH (ref 70–99)

## 2024-01-14 MED ORDER — SODIUM CHLORIDE 0.9 % IV BOLUS
500.0000 mL | Freq: Once | INTRAVENOUS | Status: AC
  Administered 2024-01-14 (×2): 500 mL via INTRAVENOUS

## 2024-01-14 MED ORDER — INSULIN STARTER KIT- PEN NEEDLES (ENGLISH)
1.0000 | Freq: Once | Status: AC
  Administered 2024-01-14 (×2): 1
  Filled 2024-01-14: qty 1

## 2024-01-14 MED ORDER — INSULIN GLARGINE-YFGN 100 UNIT/ML ~~LOC~~ SOLN
25.0000 [IU] | Freq: Every day | SUBCUTANEOUS | Status: DC
  Administered 2024-01-14 (×2): 25 [IU] via SUBCUTANEOUS
  Filled 2024-01-14 (×2): qty 0.25

## 2024-01-14 MED ORDER — INSULIN ASPART 100 UNIT/ML IJ SOLN
4.0000 [IU] | Freq: Three times a day (TID) | INTRAMUSCULAR | Status: DC
  Administered 2024-01-14 – 2024-01-15 (×8): 4 [IU] via SUBCUTANEOUS

## 2024-01-14 MED ORDER — LIVING WELL WITH DIABETES BOOK
Freq: Once | Status: AC
  Filled 2024-01-14: qty 1

## 2024-01-14 NOTE — Plan of Care (Signed)
 Problem: Education: Goal: Ability to describe self-care measures that may prevent or decrease complications (Diabetes Survival Skills Education) will improve 01/14/2024 0830 by Kiki Domino, LPN Outcome: Progressing 01/14/2024 0830 by Kiki Domino, LPN Outcome: Progressing Goal: Individualized Educational Video(s) 01/14/2024 0830 by Kiki Domino, LPN Outcome: Progressing 01/14/2024 0830 by Kiki Domino, LPN Outcome: Progressing   Problem: Coping: Goal: Ability to adjust to condition or change in health will improve 01/14/2024 0830 by Kiki Domino, LPN Outcome: Progressing 01/14/2024 0830 by Kiki Domino, LPN Outcome: Progressing   Problem: Fluid Volume: Goal: Ability to maintain a balanced intake and output will improve 01/14/2024 0830 by Kiki Domino, LPN Outcome: Progressing 01/14/2024 0830 by Kiki Domino, LPN Outcome: Progressing   Problem: Health Behavior/Discharge Planning: Goal: Ability to identify and utilize available resources and services will improve 01/14/2024 0830 by Kiki Domino, LPN Outcome: Progressing 01/14/2024 0830 by Kiki Domino, LPN Outcome: Progressing Goal: Ability to manage health-related needs will improve 01/14/2024 0830 by Kiki Domino, LPN Outcome: Progressing 01/14/2024 0830 by Kiki Domino, LPN Outcome: Progressing   Problem: Metabolic: Goal: Ability to maintain appropriate glucose levels will improve 01/14/2024 0830 by Kiki Domino, LPN Outcome: Progressing 01/14/2024 0830 by Kiki Domino, LPN Outcome: Progressing   Problem: Nutritional: Goal: Maintenance of adequate nutrition will improve 01/14/2024 0830 by Kiki Domino, LPN Outcome: Progressing 01/14/2024 0830 by Kiki Domino, LPN Outcome: Progressing Goal: Progress toward achieving an optimal weight will improve 01/14/2024 0830 by Kiki Domino, LPN Outcome: Progressing 01/14/2024 0830 by Kiki Domino, LPN Outcome: Progressing   Problem: Skin Integrity: Goal: Risk for  impaired skin integrity will decrease 01/14/2024 0830 by Kiki Domino, LPN Outcome: Progressing 01/14/2024 0830 by Kiki Domino, LPN Outcome: Progressing   Problem: Tissue Perfusion: Goal: Adequacy of tissue perfusion will improve 01/14/2024 0830 by Kiki Domino, LPN Outcome: Progressing 01/14/2024 0830 by Kiki Domino, LPN Outcome: Progressing   Problem: Education: Goal: Ability to describe self-care measures that may prevent or decrease complications (Diabetes Survival Skills Education) will improve 01/14/2024 0830 by Kiki Domino, LPN Outcome: Progressing 01/14/2024 0830 by Kiki Domino, LPN Outcome: Progressing Goal: Individualized Educational Video(s) 01/14/2024 0830 by Kiki Domino, LPN Outcome: Progressing 01/14/2024 0830 by Kiki Domino, LPN Outcome: Progressing   Problem: Cardiac: Goal: Ability to maintain an adequate cardiac output will improve 01/14/2024 0830 by Kiki Domino, LPN Outcome: Progressing 01/14/2024 0830 by Kiki Domino, LPN Outcome: Progressing   Problem: Health Behavior/Discharge Planning: Goal: Ability to identify and utilize available resources and services will improve 01/14/2024 0830 by Kiki Domino, LPN Outcome: Progressing 01/14/2024 0830 by Kiki Domino, LPN Outcome: Progressing Goal: Ability to manage health-related needs will improve 01/14/2024 0830 by Kiki Domino, LPN Outcome: Progressing 01/14/2024 0830 by Kiki Domino, LPN Outcome: Progressing   Problem: Fluid Volume: Goal: Ability to achieve a balanced intake and output will improve 01/14/2024 0830 by Kiki Domino, LPN Outcome: Progressing 01/14/2024 0830 by Kiki Domino, LPN Outcome: Progressing   Problem: Metabolic: Goal: Ability to maintain appropriate glucose levels will improve 01/14/2024 0830 by Kiki Domino, LPN Outcome: Progressing 01/14/2024 0830 by Kiki Domino, LPN Outcome: Progressing   Problem: Nutritional: Goal: Maintenance of adequate nutrition will  improve 01/14/2024 0830 by Kiki Domino, LPN Outcome: Progressing 01/14/2024 0830 by Kiki Domino, LPN Outcome: Progressing Goal: Maintenance of adequate weight for body size and type will improve 01/14/2024 0830 by Kiki Domino, LPN Outcome: Progressing 01/14/2024 0830 by Kiki Domino, LPN Outcome: Progressing   Problem: Respiratory: Goal: Will regain and/or maintain adequate ventilation 01/14/2024 0830 by Kiki Domino, LPN Outcome: Progressing 01/14/2024  0830 by Kiki Domino, LPN Outcome: Progressing   Problem: Urinary Elimination: Goal: Ability to achieve and maintain adequate renal perfusion and functioning will improve 01/14/2024 0830 by Kiki Domino, LPN Outcome: Progressing 01/14/2024 0830 by Kiki Domino, LPN Outcome: Progressing   Problem: Education: Goal: Knowledge of General Education information will improve Description: Including pain rating scale, medication(s)/side effects and non-pharmacologic comfort measures 01/14/2024 0830 by Kiki Domino, LPN Outcome: Progressing 01/14/2024 0830 by Kiki Domino, LPN Outcome: Progressing   Problem: Health Behavior/Discharge Planning: Goal: Ability to manage health-related needs will improve 01/14/2024 0830 by Kiki Domino, LPN Outcome: Progressing 01/14/2024 0830 by Kiki Domino, LPN Outcome: Progressing   Problem: Clinical Measurements: Goal: Ability to maintain clinical measurements within normal limits will improve 01/14/2024 0830 by Kiki Domino, LPN Outcome: Progressing 01/14/2024 0830 by Kiki Domino, LPN Outcome: Progressing Goal: Will remain free from infection 01/14/2024 0830 by Kiki Domino, LPN Outcome: Progressing 01/14/2024 0830 by Kiki Domino, LPN Outcome: Progressing Goal: Diagnostic test results will improve 01/14/2024 0830 by Kiki Domino, LPN Outcome: Progressing 01/14/2024 0830 by Kiki Domino, LPN Outcome: Progressing Goal: Respiratory complications will improve 01/14/2024 0830 by Kiki Domino, LPN Outcome: Progressing 01/14/2024 0830 by Kiki Domino, LPN Outcome: Progressing Goal: Cardiovascular complication will be avoided 01/14/2024 0830 by Kiki Domino, LPN Outcome: Progressing 01/14/2024 0830 by Kiki Domino, LPN Outcome: Progressing   Problem: Activity: Goal: Risk for activity intolerance will decrease 01/14/2024 0830 by Kiki Domino, LPN Outcome: Progressing 01/14/2024 0830 by Kiki Domino, LPN Outcome: Progressing   Problem: Nutrition: Goal: Adequate nutrition will be maintained 01/14/2024 0830 by Kiki Domino, LPN Outcome: Progressing 01/14/2024 0830 by Kiki Domino, LPN Outcome: Progressing   Problem: Coping: Goal: Level of anxiety will decrease 01/14/2024 0830 by Kiki Domino, LPN Outcome: Progressing 01/14/2024 0830 by Kiki Domino, LPN Outcome: Progressing   Problem: Elimination: Goal: Will not experience complications related to bowel motility 01/14/2024 0830 by Kiki Domino, LPN Outcome: Progressing 01/14/2024 0830 by Kiki Domino, LPN Outcome: Progressing Goal: Will not experience complications related to urinary retention 01/14/2024 0830 by Kiki Domino, LPN Outcome: Progressing 01/14/2024 0830 by Kiki Domino, LPN Outcome: Progressing   Problem: Pain Managment: Goal: General experience of comfort will improve and/or be controlled 01/14/2024 0830 by Kiki Domino, LPN Outcome: Progressing 01/14/2024 0830 by Kiki Domino, LPN Outcome: Progressing   Problem: Safety: Goal: Ability to remain free from injury will improve 01/14/2024 0830 by Kiki Domino, LPN Outcome: Progressing 01/14/2024 0830 by Kiki Domino, LPN Outcome: Progressing   Problem: Skin Integrity: Goal: Risk for impaired skin integrity will decrease 01/14/2024 0830 by Kiki Domino, LPN Outcome: Progressing 01/14/2024 0830 by Kiki Domino, LPN Outcome: Progressing

## 2024-01-14 NOTE — Plan of Care (Signed)
  Problem: Education: Goal: Ability to describe self-care measures that may prevent or decrease complications (Diabetes Survival Skills Education) will improve Outcome: Progressing   Problem: Clinical Measurements: Goal: Diagnostic test results will improve Outcome: Progressing   Problem: Nutrition: Goal: Adequate nutrition will be maintained Outcome: Progressing

## 2024-01-14 NOTE — Progress Notes (Signed)
 PROGRESS NOTE    Carlos Mccoy  FMW:[id number] DOB: 12-28-1980 DOA: 01/12/2024 PCP: Patient, No Pcp Per   Brief Narrative: 43 year old with past medical history significant for class II obesity presents with several days of generalized fatigue, body ache, blurry vision, polyuria polydipsia.  Episode associated of emesis.  Evaluation in the ED consistent with , pH 7.2, bicarb 18, sodium 136, anion gap 23, glucose 637, creatinine 1.5.   Assessment & Plan:   Principal Problem:   DKA (diabetic ketoacidosis) (HCC) Active Problems:   Class 2 obesity   Elevated serum creatinine   Elevated blood pressure reading   Hyperbilirubinemia   Hyperproteinemia   Erythrocytosis   Pseudohyponatremia   Leukocytosis   Sinus tachycardia  1-new diagnosis of Diabetes, DKA:  Patient presented with hyperglycemia CBG 600, anion gap 23, bicarb 17.  Beta butyrate acid 5.9, pH 7.2 -New diagnosis of diabetes will need to be discharged on insulin  -A1c: 9.9 -Treated with  IV fluids and insulin  drip. Transition to semglee , will increase to 25 units.  Increase meal coverage to 4 units. SSI Plan to keep one more day to adjust insulin  regimen CBG still 300 range.   Class II obesity -Needs life style modification.   AKI - Presents with a creatinine of 1.5, in the setting of dehydration and DKA. - Improved with IV fluids, creatinine down to 1.0   Hypertension: Systolic blood pressure 180s--- 150s He will need to be started on medications to control blood pressure Started  Metoprolol .   Hyperbilirubinemia - In the setting of dehydration follow trend  Hyperproteinemia -Monitor   Pseudohyponatremia, and hyponatremia - In the setting of hyperglycemia and DKA, also in the setting of dehydration - Improving, with fluids  Leukocytosis In the setting of demargination Mellitus Normalized.   Hemoconcentration The setting of dehydration Normalized  Estimated body mass index is 34.12 kg/m as  calculated from the following:   Height as of this encounter: 6' 1 (1.854 m).   Weight as of this encounter: 117.3 kg.   DVT prophylaxis: Lovenox  Code Status: Full code Family Communication: Care discussed with patient  Disposition Plan:  Status is: Observation The patient remains OBS appropriate and will d/c before 2 midnights.    Consultants:  None  Procedures:  None  Antimicrobials:    Subjective: Feeling better, no new complaints.    Objective: Vitals:   01/13/24 1405 01/13/24 2056 01/14/24 0519 01/14/24 1241  BP: (!) 142/91 130/85 119/88 131/85  Pulse: 96 (!) 105 (!) 110 (!) 101  Resp: 20 18 18 16   Temp: 98.2 F (36.8 C) 98.7 F (37.1 C) 98.6 F (37 C) 98.5 F (36.9 C)  TempSrc: Oral Oral Oral   SpO2: 98%   100%  Weight: 117.3 kg     Height: 6' 1 (1.854 m)       Intake/Output Summary (Last 24 hours) at 01/14/2024 1501 Last data filed at 01/14/2024 1300 Gross per 24 hour  Intake 1627.32 ml  Output --  Net 1627.32 ml   Filed Weights   01/12/24 1515 01/13/24 1405  Weight: 111.3 kg 117.3 kg    Examination:  General exam: NAD Respiratory system: CTA Cardiovascular system: S 1, S 2 RRR Gastrointestinal system: BS present, soft, nt Central nervous system: Non focal.  Extremities: no edema  Data Reviewed: I have personally reviewed following labs and imaging studies  CBC: Recent Labs  Lab 01/12/24 1138 01/12/24 1142 01/13/24 0244  WBC  --  12.0* 10.3  HGB 18.7* 18.2* 15.6  HCT 55.0* 50.8 42.2  MCV  --  85.2 85.3  PLT  --  362 297   Basic Metabolic Panel: Recent Labs  Lab 01/12/24 1542 01/13/24 0002 01/13/24 0244 01/13/24 0807 01/14/24 0516  NA 132* 133* 133* 136 134*  K 4.3 3.1* 3.1* 3.4* 3.6  CL 94* 94* 97* 98 100  CO2 21* 23 22 24  21*  GLUCOSE 410* 182* 169* 186* 275*  BUN 20 17 16 15 18   CREATININE 1.34* 1.10 1.00 0.98 0.99  CALCIUM 10.1 9.5 9.3 9.6 9.1  MG 2.3  --   --   --   --   PHOS 4.1  --   --   --   --     GFR: Estimated Creatinine Clearance: 131.8 mL/min (by C-G formula based on SCr of 0.99 mg/dL). Liver Function Tests: Recent Labs  Lab 01/12/24 1142 01/13/24 0244 01/13/24 0807 01/14/24 0516  AST 19 17 20 25   ALT 31 23 24 27   ALKPHOS 137* 89 89 90  BILITOT 3.0* 2.9* 2.7* 2.0*  PROT 9.3* 6.8 6.7 6.1*  ALBUMIN 5.1* 4.0 3.8 3.5   No results for input(s): LIPASE, AMYLASE in the last 168 hours. No results for input(s): AMMONIA in the last 168 hours. Coagulation Profile: No results for input(s): INR, PROTIME in the last 168 hours. Cardiac Enzymes: Recent Labs  Lab 01/12/24 1142  CKTOTAL 121   BNP (last 3 results) No results for input(s): PROBNP in the last 8760 hours. HbA1C: Recent Labs    01/12/24 1327  HGBA1C 9.9*   CBG: Recent Labs  Lab 01/13/24 1224 01/13/24 1639 01/13/24 2123 01/14/24 0821 01/14/24 1243  GLUCAP 170* 332* 208* 332* 290*   Lipid Profile: Recent Labs    01/12/24 1541  CHOL 212*  HDL 29*  LDLCALC 133*  TRIG 251*  CHOLHDL 7.3   Thyroid Function Tests: No results for input(s): TSH, T4TOTAL, FREET4, T3FREE, THYROIDAB in the last 72 hours. Anemia Panel: No results for input(s): VITAMINB12, FOLATE, FERRITIN, TIBC, IRON, RETICCTPCT in the last 72 hours. Sepsis Labs: No results for input(s): PROCALCITON, LATICACIDVEN in the last 168 hours.  Recent Results (from the past 240 hours)  MRSA Next Gen by PCR, Nasal     Status: None   Collection Time: 01/12/24  5:34 PM   Specimen: Nasal Mucosa; Nasal Swab  Result Value Ref Range Status   MRSA by PCR Next Gen NOT DETECTED NOT DETECTED Final    Comment: (NOTE) The GeneXpert MRSA Assay (FDA approved for NASAL specimens only), is one component of a comprehensive MRSA colonization surveillance program. It is not intended to diagnose MRSA infection nor to guide or monitor treatment for MRSA infections. Test performance is not FDA approved in patients less than 31  years old. Performed at [facility], [address] [place], [place] [address]          Radiology Studies: No results found.      Scheduled Meds:  enoxaparin  (LOVENOX ) injection  60 mg Subcutaneous Q24H   insulin  aspart  0-15 Units Subcutaneous TID WC   insulin  aspart  0-5 Units Subcutaneous QHS   insulin  aspart  4 Units Subcutaneous TID WC   insulin  glargine-yfgn  25 Units Subcutaneous Daily   metoprolol  succinate  25 mg Oral Daily   Continuous Infusions:     LOS: 1 day    Time spent: 35 Minutes    Shamiyah Ngu A Khyran Riera, MD [facility]   If 7PM-7AM, please contact night-coverage [email or web address]  01/14/2024, 3:01 PM

## 2024-01-14 NOTE — Telephone Encounter (Signed)
 Patient Product/process development scientist completed.    The patient is insured through [facility]. Patient has [employer], may use a copay card, and/or apply for patient assistance if available.    Ran test claim for Lantus  Pen and the current 30 day co-pay is $5.00.  Ran test claim for Lantus  Pen and the current 30 day co-pay is $5.00.  Ran test claim for Dexcom G7 Sensor and Requires Prior Authorization  Ran test claim for [employer] 3 Plus Sensor and Product Not Covered  This test claim was processed through [employer]- copay amounts may vary at other pharmacies due to [employer], or as the patient moves through the different stages of their insurance plan.     Reyes Sharps, CPHT Pharmacy Technician III Certified Patient Advocate [facility] Patient Advocate Team Direct Number: [phone]  Fax: [phone]

## 2024-01-14 NOTE — Inpatient Diabetes Management (Signed)
 Inpatient Diabetes Program Recommendations  AACE/ADA: New Consensus Statement on Inpatient Glycemic Control (2015)  Target Ranges:  Prepandial:   less than 140 mg/dL      Peak postprandial:   less than 180 mg/dL (1-2 hours)      Critically ill patients:  140 - 180 mg/dL   Lab Results  Component Value Date   GLUCAP 332 (H) 01/14/2024   HGBA1C 9.9 (H) 01/12/2024    Review of Glycemic Control  Diabetes history: New-onset Outpatient Diabetes medications: None Current orders for Inpatient glycemic control: Semglee  25 daily, Novolog  0-15 TID with meals and 0-5 HS + 4 units TID  HgbA1C - 9.9%  Inpatient Diabetes Program Recommendations:    Spoke with patient about new diabetes diagnosis.  Discussed A1C results (9.9%) and explained what an A1C is and informed patient that his current A1C indicates an average glucose of 236 mg/dl over the past 2-3 months. Discussed basic pathophysiology of DM Type 2, basic home care, importance of checking CBGs and maintaining good CBG control to prevent long-term and short-term complications. Reviewed glucose and A1C goals and explained that patient will need to continue to  Reviewed signs and symptoms of hyperglycemia and hypoglycemia along with treatment for both. Discussed impact of nutrition, exercise, stress, sickness, and medications on diabetes control. Reviewed Living Well with diabetes booklet and encouraged patient to read through entire book.  Asked patient to check his glucose 4 times per day (before meals and at bedtime) and to keep a log book of glucose readings and insulin  taken. Explained how the doctor he follows up with can use the log book to continue to make insulin  adjustments if needed.  Educated patient on insulin  pen use at home. Reviewed contents of insulin  flexpen starter kit. Reviewed all steps if insulin  pen including attachment of needle, 2-unit air shot, dialing up dose, giving injection, removing needle, disposal of sharps, storage of  unused insulin , disposal of insulin  etc. Patient able to provide successful return demonstration. Also reviewed troubleshooting with insulin  pen. MD to give patient Rxs for insulin  pens and insulin  pen needles. Discussed CGMs with pt. States he is unsure about having something on his arm, thinks he would rather just stick fingers, but will read about the [facility] on line.  Wife has made PCP appt for pt in September. Pt states he hasn't been to a PCP in many years. Stressed importance of f/u on a regular basis (every 3 months) until blood sugars achieve good control. Pt states he is starting to go back to gym and appears very motivated to control his blood sugars.  Discussed above with RN. Received message from pharmacy that pt does not have prescription card on file at pharmacy. Let pt know and he will call his insurance company this afternoon.   Continue to follow.  Thank you. Shona Brandy, RD, LDN, CDCES Inpatient Diabetes Coordinator [phone]

## 2024-01-15 DIAGNOSIS — E1110 Type 2 diabetes mellitus with ketoacidosis without coma: Secondary | ICD-10-CM | POA: Diagnosis not present

## 2024-01-15 LAB — GLUCOSE, CAPILLARY
Glucose-Capillary: 213 mg/dL — ABNORMAL HIGH (ref 70–99)
Glucose-Capillary: 289 mg/dL — ABNORMAL HIGH (ref 70–99)

## 2024-01-15 MED ORDER — INSULIN GLARGINE-YFGN 100 UNIT/ML ~~LOC~~ SOLN
30.0000 [IU] | Freq: Every day | SUBCUTANEOUS | Status: DC
  Administered 2024-01-15 (×2): 30 [IU] via SUBCUTANEOUS
  Filled 2024-01-15: qty 0.3

## 2024-01-15 MED ORDER — LANCET DEVICE MISC: 1.0000 | Freq: Three times a day (TID) | 0 refills | Status: DC

## 2024-01-15 MED ORDER — BLOOD GLUCOSE TEST VI STRP
1.0000 | ORAL_STRIP | Freq: Three times a day (TID) | 0 refills | Status: DC
Start: 2024-01-15 — End: 2024-01-18

## 2024-01-15 MED ORDER — INSULIN GLARGINE 100 UNIT/ML SOLOSTAR PEN: 30.0000 [IU] | PEN_INJECTOR | Freq: Every day | SUBCUTANEOUS | 11 refills | Status: DC

## 2024-01-15 MED ORDER — LANCETS MISC. MISC: 1.0000 | Freq: Three times a day (TID) | 0 refills | Status: DC

## 2024-01-15 MED ORDER — METOPROLOL SUCCINATE ER 25 MG PO TB24: 25.0000 mg | ORAL_TABLET | Freq: Every day | ORAL | 0 refills | Status: AC

## 2024-01-15 MED ORDER — PEN NEEDLES 31G X 5 MM MISC: 1.0000 | Freq: Three times a day (TID) | 6 refills | Status: DC

## 2024-01-15 MED ORDER — BLOOD GLUCOSE MONITORING SUPPL DEVI: 1.0000 | Freq: Three times a day (TID) | 0 refills | Status: DC

## 2024-01-15 MED ORDER — NOVOLOG FLEXPEN RELION 100 UNIT/ML ~~LOC~~ SOPN: 8.0000 [IU] | PEN_INJECTOR | Freq: Three times a day (TID) | SUBCUTANEOUS | 11 refills | Status: DC

## 2024-01-15 NOTE — Discharge Summary (Signed)
 Physician Discharge Summary   Patient: Carlos Mccoy MRN: [id number] DOB: 14-Oct-1980  Admit date:     01/12/2024  Discharge date: 01/15/24  Discharge Physician: Owen DELENA Lore   PCP: Patient, No Pcp Per   Recommendations at discharge:   Needs further adjustment of insulin  regimen. He will need close follow up Needs further Management of BP  Follow up LFT to follow bilirubin level.   Discharge Diagnoses: Principal Problem:   DKA (diabetic ketoacidosis) (HCC) Active Problems:   Class 2 obesity   Elevated serum creatinine   Elevated blood pressure reading   Hyperbilirubinemia   Hyperproteinemia   Erythrocytosis   Pseudohyponatremia   Leukocytosis   Sinus tachycardia  Resolved Problems:   * No resolved hospital problems. *  Hospital Course: 43 year old with past medical history significant for class II obesity presents with several days of generalized fatigue, body ache, blurry vision, polyuria polydipsia.  Episode associated of emesis.   Evaluation in the ED consistent with , pH 7.2, bicarb 18, sodium 136, anion gap 23, glucose 637, creatinine 1.5.  Assessment and Plan: 1-new diagnosis of Diabetes, DKA:  Patient presented with hyperglycemia CBG 600, anion gap 23, bicarb 17.  Beta butyrate acid 5.9, pH 7.2 -New diagnosis of diabetes will need to be discharged on insulin  -A1c: 9.9 -Treated with  IV fluids and insulin  drip. Plan to discharge on lantus  30 units daily, and 8 units novolog  with meals. He will need further adjustment out patient.  CBG down to 200 range.  Will make referral to Endocrinologist.  He is eager to get better and work on diet   Class II obesity -Needs life style modification.    AKI - Presents with a creatinine of 1.5, in the setting of dehydration and DKA. - Improved with IV fluids, creatinine down to 1.0 - He will need ACE out patient.    Hypertension: Systolic blood pressure 180s--- 150s He will need to be started on medications to  control blood pressure Started  Metoprolol . To assist with tachycardia and HTN>  Refer to PCP as well/.    Hyperbilirubinemia - In the setting of dehydration follow trend  needs follow up out patient. Trending down.   Hyperproteinemia -Monitor  Resolved.    Pseudohyponatremia, and hyponatremia - In the setting of hyperglycemia and DKA, also in the setting of dehydration - Improving, with fluids   Leukocytosis In the setting of demargination Mellitus Normalized.    Hemoconcentration The setting of dehydration Normalized          Consultants: None Procedures performed: none  Disposition: Home Diet recommendation:  Discharge Diet Orders (From admission, onward)     Start     Ordered   01/15/24 0000  Diet - low sodium heart healthy        01/15/24 1132           Carb modified diet DISCHARGE MEDICATION: Allergies as of 01/15/2024   No Known Allergies      Medication List     STOP taking these medications    ibuprofen  800 MG tablet Commonly known as: ADVIL        TAKE these medications    acetaminophen  500 MG tablet Commonly known as: TYLENOL  Take 500 mg by mouth every 6 (six) hours as needed for pain (pain).   Blood Glucose Monitoring Suppl Devi 1 each by Does not apply route in the morning, at noon, and at bedtime. May substitute to any manufacturer covered by patient's insurance.   BLOOD GLUCOSE TEST  STRIPS Strp 1 each by In Vitro route in the morning, at noon, and at bedtime. May substitute to any manufacturer covered by patient's insurance.   insulin  glargine 100 UNIT/ML Solostar Pen Commonly known as: LANTUS  Inject 30 Units into the skin daily.   Lancet Device Misc 1 each by Does not apply route in the morning, at noon, and at bedtime. May substitute to any manufacturer covered by patient's insurance.   Lancets Misc. Misc 1 each by Does not apply route in the morning, at noon, and at bedtime. May substitute to any manufacturer covered  by patient's insurance.   metoprolol  succinate 25 MG 24 hr tablet Commonly known as: TOPROL -XL Take 1 tablet (25 mg total) by mouth daily. Start taking on: January 16, 2024   NovoLOG  FlexPen 100 UNIT/ML FlexPen Generic drug: insulin  aspart Inject 8 Units into the skin 3 (three) times daily with meals.   Pen Needles 31G X 5 MM Misc 1 Application by Does not apply route 3 (three) times daily.        Discharge Exam: Filed Weights   01/12/24 1515 01/13/24 1405  Weight: 111.3 kg 117.3 kg   General: NAD  Condition at discharge: good  The results of significant diagnostics from this hospitalization (including imaging, microbiology, ancillary and laboratory) are listed below for reference.   Imaging Studies: No results found.  Microbiology: Results for orders placed or performed during the hospital encounter of 01/12/24  MRSA Next Gen by PCR, Nasal     Status: None   Collection Time: 01/12/24  5:34 PM   Specimen: Nasal Mucosa; Nasal Swab  Result Value Ref Range Status   MRSA by PCR Next Gen NOT DETECTED NOT DETECTED Final    Comment: (NOTE) The GeneXpert MRSA Assay (FDA approved for NASAL specimens only), is one component of a comprehensive MRSA colonization surveillance program. It is not intended to diagnose MRSA infection nor to guide or monitor treatment for MRSA infections. Test performance is not FDA approved in patients less than 14 years old. Performed at [facility], [address] [place], [place] [address]     Labs: CBC: Recent Labs  Lab 01/12/24 1138 01/12/24 1142 01/13/24 0244  WBC  --  12.0* 10.3  HGB 18.7* 18.2* 15.6  HCT 55.0* 50.8 42.2  MCV  --  85.2 85.3  PLT  --  362 297   Basic Metabolic Panel: Recent Labs  Lab 01/12/24 1542 01/13/24 0002 01/13/24 0244 01/13/24 0807 01/14/24 0516  NA 132* 133* 133* 136 134*  K 4.3 3.1* 3.1* 3.4* 3.6  CL 94* 94* 97* 98 100  CO2 21* 23 22 24  21*  GLUCOSE 410* 182* 169* 186* 275*   BUN 20 17 16 15 18   CREATININE 1.34* 1.10 1.00 0.98 0.99  CALCIUM 10.1 9.5 9.3 9.6 9.1  MG 2.3  --   --   --   --   PHOS 4.1  --   --   --   --    Liver Function Tests: Recent Labs  Lab 01/12/24 1142 01/13/24 0244 01/13/24 0807 01/14/24 0516  AST 19 17 20 25   ALT 31 23 24 27   ALKPHOS 137* 89 89 90  BILITOT 3.0* 2.9* 2.7* 2.0*  PROT 9.3* 6.8 6.7 6.1*  ALBUMIN 5.1* 4.0 3.8 3.5   CBG: Recent Labs  Lab 01/14/24 0821 01/14/24 1243 01/14/24 1655 01/14/24 2049 01/15/24 0725  GLUCAP 332* 290* 255* 215* 213*    Discharge time spent: greater than 30  minutes.  Signed: Owen DELENA Lore, MD [facility] 01/15/2024

## 2024-01-15 NOTE — Progress Notes (Signed)
   01/15/24 1004  TOC Brief Assessment  Insurance and Status Reviewed  Patient has primary care physician No (Not listed in chart)  Home environment has been reviewed Mobile Home  Prior level of function: Independent  Prior/Current Home Services No current home services  Social Drivers of Health Review SDOH reviewed no interventions necessary  Readmission risk has been reviewed Yes  Transition of care needs no transition of care needs at this time    Signed: Heather Saltness, MSW, LCSW Clinical Social Worker Inpatient Care Management 01/15/2024 10:05 AM

## 2024-01-15 NOTE — Inpatient Diabetes Management (Signed)
 Inpatient Diabetes Program Recommendations  AACE/ADA: New Consensus Statement on Inpatient Glycemic Control (2015)  Target Ranges:  Prepandial:   less than 140 mg/dL      Peak postprandial:   less than 180 mg/dL (1-2 hours)      Critically ill patients:  140 - 180 mg/dL   Lab Results  Component Value Date   GLUCAP 213 (H) 01/15/2024   HGBA1C 9.9 (H) 01/12/2024    Review of Glycemic Control   Current orders for Inpatient glycemic control: Semglee  30 units daily, Novolog  0-15 TID with meals and 0-5 HS + 4 units TID  Reviewed basic survival skills, hypoglycemia s/s and treatment, monitoring at least 3-4x/day, healthy diet, portion control and getting daily exercise. Has f/u appt with PCP 02/04/2024.   Inpatient Diabetes Program Recommendations:    Discharge Recommendations: Long acting recommendations: Insulin  Glargine (LANTUS ) Solostar Pen 30 units daily  Short acting recommendations:  Meal + Correction coverage Insulin  aspart (NOVOLOG ) FlexPen  Moderate Scale.  6-8 units TID with meals + sliding scale Supply/Referral recommendations: Pen needles - standard Lancets Lancet device Test strips Glucometer Referral to Nutrition & Diab Services   Use Adult Diabetes Insulin  Treatment Post Discharge order set.  For likely d/c today. Pt seems motivated to manage his diabetes.   Thank you. Shona Brandy, RD, LDN, CDCES Inpatient Diabetes Coordinator [phone]

## 2024-01-16 ENCOUNTER — Ambulatory Visit

## 2024-01-18 ENCOUNTER — Other Ambulatory Visit: Payer: Self-pay

## 2024-01-18 ENCOUNTER — Encounter (HOSPITAL_COMMUNITY)
Admission: EM | Disposition: A | Discharge status: Home / Self Care | Payer: Self-pay | Source: Home / Self Care | Attending: Internal Medicine

## 2024-01-18 ENCOUNTER — Encounter (HOSPITAL_COMMUNITY): Payer: Self-pay

## 2024-01-18 ENCOUNTER — Emergency Department (HOSPITAL_COMMUNITY)

## 2024-01-18 ENCOUNTER — Inpatient Hospital Stay (HOSPITAL_COMMUNITY): Admitting: Anesthesiology

## 2024-01-18 ENCOUNTER — Observation Stay (HOSPITAL_COMMUNITY)
Admission: EM | Admit: 2024-01-18 | Discharge: 2024-01-20 | Disposition: A | Discharge status: Home / Self Care | Attending: Internal Medicine | Admitting: Internal Medicine

## 2024-01-18 DIAGNOSIS — Z87891 Personal history of nicotine dependence: Secondary | ICD-10-CM | POA: Diagnosis not present

## 2024-01-18 DIAGNOSIS — Z794 Long term (current) use of insulin: Secondary | ICD-10-CM | POA: Diagnosis not present

## 2024-01-18 DIAGNOSIS — E119 Type 2 diabetes mellitus without complications: Secondary | ICD-10-CM | POA: Insufficient documentation

## 2024-01-18 DIAGNOSIS — K921 Melena: Secondary | ICD-10-CM | POA: Diagnosis not present

## 2024-01-18 DIAGNOSIS — E860 Dehydration: Principal | ICD-10-CM | POA: Insufficient documentation

## 2024-01-18 DIAGNOSIS — K922 Gastrointestinal hemorrhage, unspecified: Secondary | ICD-10-CM | POA: Diagnosis present

## 2024-01-18 DIAGNOSIS — K2951 Unspecified chronic gastritis with bleeding: Principal | ICD-10-CM | POA: Insufficient documentation

## 2024-01-18 DIAGNOSIS — D62 Acute posthemorrhagic anemia: Secondary | ICD-10-CM | POA: Diagnosis not present

## 2024-01-18 DIAGNOSIS — K2980 Duodenitis without bleeding: Secondary | ICD-10-CM | POA: Diagnosis not present

## 2024-01-18 DIAGNOSIS — I10 Essential (primary) hypertension: Secondary | ICD-10-CM | POA: Diagnosis not present

## 2024-01-18 DIAGNOSIS — E1165 Type 2 diabetes mellitus with hyperglycemia: Secondary | ICD-10-CM | POA: Diagnosis not present

## 2024-01-18 DIAGNOSIS — K259 Gastric ulcer, unspecified as acute or chronic, without hemorrhage or perforation: Secondary | ICD-10-CM | POA: Insufficient documentation

## 2024-01-18 DIAGNOSIS — R42 Dizziness and giddiness: Secondary | ICD-10-CM | POA: Diagnosis present

## 2024-01-18 HISTORY — PX: ESOPHAGOGASTRODUODENOSCOPY: SHX5428

## 2024-01-18 HISTORY — DX: Essential (primary) hypertension: I10

## 2024-01-18 HISTORY — DX: Type 2 diabetes mellitus without complications: E11.9

## 2024-01-18 LAB — CBC WITH DIFFERENTIAL/PLATELET
Abs Granulocyte: 7.7 K/uL — ABNORMAL HIGH (ref 1.5–6.5)
Abs Immature Granulocytes: 0.05 K/uL (ref 0.00–0.07)
Abs Immature Granulocytes: 0.07 K/uL (ref 0.00–0.07)
Basophils Absolute: 0.1 K/uL (ref 0.0–0.1)
Basophils Absolute: 0 K/uL (ref 0.0–0.1)
Basophils Relative: 0 %
Basophils Relative: 0 %
Eosinophils Absolute: 0.1 K/uL (ref 0.0–0.5)
Eosinophils Absolute: 0 K/uL (ref 0.0–0.5)
Eosinophils Relative: 0 %
Eosinophils Relative: 0 %
HCT: 29.7 % — ABNORMAL LOW (ref 39.0–52.0)
Hemoglobin: 12.7 g/dL — ABNORMAL LOW (ref 13.0–17.0)
Hemoglobin: 10.9 g/dL — ABNORMAL LOW (ref 13.0–17.0)
Immature Granulocytes: 0 %
Immature Granulocytes: 1 %
Lymphocytes Relative: 33 %
Lymphocytes Relative: 27 %
Lymphs Abs: 4.6 K/uL — ABNORMAL HIGH (ref 0.7–4.0)
Lymphs Abs: 3.1 K/uL (ref 0.7–4.0)
MCH: 30.6 pg (ref 26.0–34.0)
MCHC: 36.7 g/dL — ABNORMAL HIGH (ref 30.0–36.0)
MCV: 83.4 fL (ref 80.0–100.0)
Monocytes Absolute: 1.3 K/uL — ABNORMAL HIGH (ref 0.1–1.0)
Monocytes Absolute: 1.3 K/uL — ABNORMAL HIGH (ref 0.1–1.0)
Monocytes Relative: 9 %
Monocytes Relative: 12 %
Neutro Abs: 7.7 K/uL (ref 1.7–7.7)
Neutro Abs: 7.1 K/uL (ref 1.7–7.7)
Neutrophils Relative %: 58 %
Neutrophils Relative %: 60 %
Platelets: 378 K/uL (ref 150–400)
Platelets: 302 K/uL (ref 150–400)
RBC: 3.56 MIL/uL — ABNORMAL LOW (ref 4.22–5.81)
RDW: 11 % — ABNORMAL LOW (ref 11.5–15.5)
Smear Review: NORMAL
WBC: 13.7 K/uL — ABNORMAL HIGH (ref 4.0–10.5)
WBC: 11.6 K/uL — ABNORMAL HIGH (ref 4.0–10.5)
nRBC: 0 % (ref 0.0–0.2)
nRBC: 0 % (ref 0.0–0.2)

## 2024-01-18 LAB — COMPREHENSIVE METABOLIC PANEL WITH GFR
ALT: 53 U/L — ABNORMAL HIGH (ref 0–44)
AST: 35 U/L (ref 15–41)
Albumin: 4.1 g/dL (ref 3.5–5.0)
Alkaline Phosphatase: 90 U/L (ref 38–126)
Anion gap: 15 (ref 5–15)
BUN: 23 mg/dL — ABNORMAL HIGH (ref 6–20)
CO2: 20 mmol/L — ABNORMAL LOW (ref 22–32)
Calcium: 9.5 mg/dL (ref 8.9–10.3)
Chloride: 99 mmol/L (ref 98–111)
Creatinine, Ser: 1.14 mg/dL (ref 0.61–1.24)
GFR, Estimated: >60 mL/min (ref >60)
Glucose, Bld: 260 mg/dL — ABNORMAL HIGH (ref 70–99)
Potassium: 3 mmol/L — ABNORMAL LOW (ref 3.5–5.1)
Sodium: 134 mmol/L — ABNORMAL LOW (ref 135–145)
Total Bilirubin: 1.8 mg/dL — ABNORMAL HIGH (ref 0.0–1.2)
Total Protein: 7.4 g/dL (ref 6.5–8.1)

## 2024-01-18 LAB — URINALYSIS, ROUTINE W REFLEX MICROSCOPIC
Bacteria, UA: NONE SEEN
Bilirubin Urine: NEGATIVE
Glucose, UA: 50 mg/dL — AB
Hgb urine dipstick: NEGATIVE
Ketones, ur: 80 mg/dL — AB
Leukocytes,Ua: NEGATIVE
Nitrite: NEGATIVE
Protein, ur: 30 mg/dL — AB
Specific Gravity, Urine: 1.031 — ABNORMAL HIGH (ref 1.005–1.030)
pH: 5 (ref 5.0–8.0)

## 2024-01-18 LAB — CBG MONITORING, ED
Glucose-Capillary: 259 mg/dL — ABNORMAL HIGH (ref 70–99)
Glucose-Capillary: 198 mg/dL — ABNORMAL HIGH (ref 70–99)

## 2024-01-18 LAB — BLOOD GAS, VENOUS
Acid-base deficit: 3.8 mmol/L — ABNORMAL HIGH (ref 0.0–2.0)
Bicarbonate: 21.5 mmol/L (ref 20.0–28.0)
O2 Saturation: 51.5 %
Patient temperature: 37
pCO2, Ven: 39 mmHg — ABNORMAL LOW (ref 44–60)
pH, Ven: 7.35 (ref 7.25–7.43)
pO2, Ven: 31 mmHg — CL (ref 32–45)

## 2024-01-18 LAB — HEMOGLOBIN AND HEMATOCRIT, BLOOD
HCT: 27.6 % — ABNORMAL LOW (ref 39.0–52.0)
HCT: 26 % — ABNORMAL LOW (ref 39.0–52.0)
Hemoglobin: 10.1 g/dL — ABNORMAL LOW (ref 13.0–17.0)
Hemoglobin: 9.7 g/dL — ABNORMAL LOW (ref 13.0–17.0)

## 2024-01-18 LAB — D-DIMER, QUANTITATIVE: D-Dimer, Quant: <0.27 ug{FEU}/mL (ref 0.00–0.50)

## 2024-01-18 LAB — MAGNESIUM: Magnesium: 1.8 mg/dL (ref 1.7–2.4)

## 2024-01-18 LAB — GLUCOSE, CAPILLARY
Glucose-Capillary: 156 mg/dL — ABNORMAL HIGH (ref 70–99)
Glucose-Capillary: 178 mg/dL — ABNORMAL HIGH (ref 70–99)

## 2024-01-18 LAB — POC OCCULT BLOOD, ED: Fecal Occult Bld: POSITIVE — AB

## 2024-01-18 LAB — ABO/RH: ABO/RH(D): O POS

## 2024-01-18 SURGERY — EGD (ESOPHAGOGASTRODUODENOSCOPY)
Anesthesia: Monitor Anesthesia Care

## 2024-01-18 MED ORDER — ACETAMINOPHEN 325 MG PO TABS: 650.0000 mg | ORAL_TABLET | Freq: Four times a day (QID) | ORAL | Status: DC | PRN

## 2024-01-18 MED ORDER — PROPOFOL 10 MG/ML IV BOLUS
INTRAVENOUS | Status: DC | PRN
  Administered 2024-01-18 (×2): 30 mg via INTRAVENOUS

## 2024-01-18 MED ORDER — ALBUTEROL SULFATE (2.5 MG/3ML) 0.083% IN NEBU
2.5000 mg | INHALATION_SOLUTION | RESPIRATORY_TRACT | Status: DC | PRN
Start: 2024-01-18 — End: 2024-01-20

## 2024-01-18 MED ORDER — PROPOFOL 500 MG/50ML IV EMUL
INTRAVENOUS | Status: AC
  Filled 2024-01-18: qty 50

## 2024-01-18 MED ORDER — PANTOPRAZOLE SODIUM 40 MG IV SOLR: 40.0000 mg | Freq: Two times a day (BID) | INTRAVENOUS | Status: DC

## 2024-01-18 MED ORDER — PANTOPRAZOLE SODIUM 40 MG IV SOLR
40.0000 mg | INTRAVENOUS | Status: AC
  Administered 2024-01-18 (×2): 40 mg via INTRAVENOUS
  Filled 2024-01-18 (×2): qty 10

## 2024-01-18 MED ORDER — SODIUM CHLORIDE 0.9 % IV SOLN: INTRAVENOUS | Status: DC

## 2024-01-18 MED ORDER — SODIUM CHLORIDE 0.9 % IV BOLUS
1000.0000 mL | Freq: Once | INTRAVENOUS | Status: AC
  Administered 2024-01-18: 1000 mL via INTRAVENOUS

## 2024-01-18 MED ORDER — ONDANSETRON HCL 4 MG/2ML IJ SOLN: 4.0000 mg | Freq: Four times a day (QID) | INTRAMUSCULAR | Status: DC | PRN

## 2024-01-18 MED ORDER — ACETAMINOPHEN 650 MG RE SUPP: 650.0000 mg | Freq: Four times a day (QID) | RECTAL | Status: DC | PRN

## 2024-01-18 MED ORDER — PANTOPRAZOLE SODIUM 40 MG PO TBEC
40.0000 mg | DELAYED_RELEASE_TABLET | Freq: Two times a day (BID) | ORAL | Status: DC
  Administered 2024-01-18 – 2024-01-20 (×4): 40 mg via ORAL
  Filled 2024-01-18 (×4): qty 1

## 2024-01-18 MED ORDER — INSULIN ASPART 100 UNIT/ML IJ SOLN
0.0000 [IU] | INTRAMUSCULAR | Status: DC
  Administered 2024-01-18 (×3): 3 [IU] via SUBCUTANEOUS
  Administered 2024-01-19 (×2): 2 [IU] via SUBCUTANEOUS
  Administered 2024-01-19: 8 [IU] via SUBCUTANEOUS
  Administered 2024-01-19 – 2024-01-20 (×3): 2 [IU] via SUBCUTANEOUS
  Filled 2024-01-18: qty 0.15

## 2024-01-18 MED ORDER — INSULIN GLARGINE-YFGN 100 UNIT/ML ~~LOC~~ SOLN
15.0000 [IU] | Freq: Every day | SUBCUTANEOUS | Status: DC
  Administered 2024-01-19 – 2024-01-20 (×2): 15 [IU] via SUBCUTANEOUS
  Filled 2024-01-18 (×3): qty 0.15

## 2024-01-18 MED ORDER — SODIUM CHLORIDE 0.9 % IV SOLN
INTRAVENOUS | Status: AC | PRN
  Administered 2024-01-18: 500 mL via INTRAMUSCULAR

## 2024-01-18 MED ORDER — LACTATED RINGERS IV BOLUS
1000.0000 mL | Freq: Once | INTRAVENOUS | Status: AC
  Administered 2024-01-18: 1000 mL via INTRAVENOUS

## 2024-01-18 MED ORDER — ONDANSETRON HCL 4 MG PO TABS: 4.0000 mg | ORAL_TABLET | Freq: Four times a day (QID) | ORAL | Status: DC | PRN

## 2024-01-18 MED ORDER — PROPOFOL 500 MG/50ML IV EMUL
INTRAVENOUS | Status: DC | PRN
Start: 2024-01-18 — End: 2024-01-18
  Administered 2024-01-18: 125 ug/kg/min via INTRAVENOUS

## 2024-01-18 MED ORDER — METOPROLOL SUCCINATE ER 25 MG PO TB24
25.0000 mg | ORAL_TABLET | Freq: Every day | ORAL | Status: DC
  Administered 2024-01-18 – 2024-01-20 (×3): 25 mg via ORAL
  Filled 2024-01-18 (×3): qty 1

## 2024-01-18 MED ORDER — POTASSIUM CHLORIDE CRYS ER 20 MEQ PO TBCR
40.0000 meq | EXTENDED_RELEASE_TABLET | Freq: Once | ORAL | Status: AC
  Administered 2024-01-18: 40 meq via ORAL
  Filled 2024-01-18: qty 2

## 2024-01-18 NOTE — Anesthesia Procedure Notes (Signed)
 Procedure Name: MAC Date/Time: 01/18/2024 12:50 PM  Performed by: Dasie Nena PARAS, CRNAPre-anesthesia Checklist: Patient identified, Emergency Drugs available, Suction available, Patient being monitored and Timeout performed Oxygen Delivery Method: Simple face mask Preoxygenation: POM used. Placement Confirmation: positive ETCO2

## 2024-01-18 NOTE — ED Provider Notes (Signed)
 [place] EMERGENCY DEPARTMENT AT [facility] Provider Note   CSN: [id number] Arrival date & time: 01/18/24  0109     Patient presents with: Dizziness   Carlos Mccoy is a 43 y.o. male.   The history is provided by the patient.  Dizziness Carlos Mccoy is a 43 y.o. male who presents to the Emergency Department complaining of dizziness. He presents to the emergency department for evaluation of feeling dizzy since he left the hospital on Tuesday. He was admitted for new onset diabetes. He has been compliant with medications since leaving the hospital. He feels unsteady and has trouble walking even in his home. No headache, chest pain, Donel pain, nausea, vomiting. No dysuria. He reports that his sugar has been up and down since hospital discharge. He does have decreased appetite. He takes rare ibuprofen , aspirin. No tobacco or alcohol use. He does use occasional marijuana. No recent use. No history of ulcers or G.I. bleeding. He does report having a black stool when he was in the hospital on Saturday but he attributed it to the medications that he was on.       Prior to Admission medications   Medication Sig Start Date End Date Taking? Authorizing Provider  acetaminophen  (TYLENOL ) 500 MG tablet Take 500 mg by mouth every 6 (six) hours as needed for pain (pain).    [provider]  Blood Glucose Monitoring Suppl DEVI 1 each by Does not apply route in the morning, at noon, and at bedtime. May substitute to any manufacturer covered by patient's insurance. 01/15/24   Regalado, Belkys A, MD  Glucose Blood (BLOOD GLUCOSE TEST STRIPS) STRP 1 each by In Vitro route in the morning, at noon, and at bedtime. May substitute to any manufacturer covered by patient's insurance. 01/15/24 02/17/24  Regalado, Owen A, MD  insulin  aspart (NOVOLOG  FLEXPEN) 100 UNIT/ML FlexPen Inject 8 Units into the skin 3 (three) times daily with meals. 01/15/24   Regalado, Belkys A, MD  insulin  glargine  (LANTUS ) 100 UNIT/ML Solostar Pen Inject 30 Units into the skin daily. 01/15/24   Regalado, Belkys A, MD  Insulin  Pen Needle (PEN NEEDLES) 31G X 5 MM MISC 1 Application by Does not apply route 3 (three) times daily. 01/15/24   Regalado, Owen LABOR, MD  Lancet Device MISC 1 each by Does not apply route in the morning, at noon, and at bedtime. May substitute to any manufacturer covered by patient's insurance. 01/15/24 02/14/24  Regalado, Owen LABOR, MD  Lancets Misc. MISC 1 each by Does not apply route in the morning, at noon, and at bedtime. May substitute to any manufacturer covered by patient's insurance. 01/15/24 02/14/24  Regalado, Belkys A, MD  metoprolol  succinate (TOPROL -XL) 25 MG 24 hr tablet Take 1 tablet (25 mg total) by mouth daily. 01/16/24   Regalado, Owen LABOR, MD    Allergies: Patient has no known allergies.    Review of Systems  Neurological:  Positive for dizziness.  All other systems reviewed and are negative.   Updated Vital Signs BP (!) 119/107   Pulse (!) 112   Temp 98.6 F (37 C) (Oral)   Resp 14   Ht 6' 1 (1.854 m)   Wt 108.9 kg   SpO2 100%   BMI 31.66 kg/m   Physical Exam Vitals and nursing note reviewed.  Constitutional:      Appearance: He is well-developed.  HENT:     Head: Normocephalic and atraumatic.  Cardiovascular:     Rate and Rhythm:  Regular rhythm. Tachycardia present.     Heart sounds: No murmur heard. Pulmonary:     Effort: Pulmonary effort is normal. No respiratory distress.     Breath sounds: Normal breath sounds.  Abdominal:     Palpations: Abdomen is soft.     Tenderness: There is no abdominal tenderness. There is no guarding or rebound.  Genitourinary:    Comments: Small amount of very dark stool in the rectal Musculoskeletal:        General: No tenderness.  Skin:    General: Skin is warm and dry.  Neurological:     Mental Status: He is alert and oriented to person, place, and time.     Comments: No asymmetry of facial movements.  5/5  strength in all four extremities with sensation to light touch intact in all four extremities.  No ataxia on FTN bilaterally  Psychiatric:        Behavior: Behavior normal.     (all labs ordered are listed, but only abnormal results are displayed) Labs Reviewed  COMPREHENSIVE METABOLIC PANEL WITH GFR - Abnormal; Notable for the following components:      Result Value   Sodium 134 (*)    Potassium 3.0 (*)    CO2 20 (*)    Glucose, Bld 260 (*)    BUN 23 (*)    ALT 53 (*)    Total Bilirubin 1.8 (*)    All other components within normal limits  URINALYSIS, ROUTINE W REFLEX MICROSCOPIC - Abnormal; Notable for the following components:   Specific Gravity, Urine 1.031 (*)    Glucose, UA 50 (*)    Ketones, ur 80 (*)    Protein, ur 30 (*)    All other components within normal limits  CBC WITH DIFFERENTIAL/PLATELET - Abnormal; Notable for the following components:   WBC 13.7 (*)    Hemoglobin 12.7 (*)    Lymphs Abs 4.6 (*)    Monocytes Absolute 1.3 (*)    Abs Granulocyte 7.7 (*)    All other components within normal limits  CBC WITH DIFFERENTIAL/PLATELET - Abnormal; Notable for the following components:   WBC 11.6 (*)    RBC 3.56 (*)    Hemoglobin 10.9 (*)    HCT 29.7 (*)    MCHC 36.7 (*)    RDW 11.0 (*)    Monocytes Absolute 1.3 (*)    All other components within normal limits  BLOOD GAS, VENOUS - Abnormal; Notable for the following components:   pCO2, Ven 39 (*)    pO2, Ven 31 (*)    Acid-base deficit 3.8 (*)    All other components within normal limits  CBG MONITORING, ED - Abnormal; Notable for the following components:   Glucose-Capillary 259 (*)    All other components within normal limits  D-DIMER, QUANTITATIVE  MAGNESIUM  CBG MONITORING, ED  POC OCCULT BLOOD, ED  ABO/RH    EKG: EKG Interpretation Date/Time:  Friday January 18 2024 01:18:31 EDT Ventricular Rate:  142 PR Interval:  112 QRS Duration:  82 QT Interval:  300 QTC Calculation: 462 R  Axis:   45  Text Interpretation: Sinus tachycardia Atrial premature complex Abnormal R-wave progression, early transition Nonspecific T abnormalities, inferior leads Baseline wander in lead(s) V3 V4 V5 V6 Confirmed by Griselda Norris [phone]) on 01/18/2024 1:32:53 AM  Radiology: [facility] Chest 2 View Result Date: 01/18/2024 CLINICAL DATA:  Weakness and shortness of breath EXAM: CHEST - 2 VIEW COMPARISON:  09/10/2009 FINDINGS: Cardiac shadows within normal  limits. The lungs are well aerated bilaterally. No focal infiltrate or effusion is seen. No bony abnormality is noted. IMPRESSION: No active cardiopulmonary disease. Electronically Signed   By: Oneil Devonshire M.D.   On: 01/18/2024 03:38     Procedures   Medications Ordered in the ED  pantoprazole  (PROTONIX ) injection 40 mg (40 mg Intravenous Given 01/18/24 0557)    Followed by  pantoprazole  (PROTONIX ) injection 40 mg (has no administration in time range)  sodium chloride  0.9 % bolus 1,000 mL (0 mLs Intravenous Stopped 01/18/24 0434)  potassium chloride  SA (KLOR-CON  M) CR tablet 40 mEq (40 mEq Oral Given 01/18/24 0339)  lactated ringers  bolus 1,000 mL (1,000 mLs Intravenous New Bag/Given 01/18/24 0437)                                    Medical Decision Making Amount and/or Complexity of Data Reviewed Labs: ordered. Radiology: ordered.  Risk Prescription drug management. Decision regarding hospitalization.   Patient with recent diagnosis of new onset diabetes here for evaluation of dizziness. He is found to be tachycardic, orthostatic with significant rise in heart rate but stable blood pressure outstanding. He was treated with IV fluids due to concern for dehydration. Labs with mild elevation and BUN compared to prior. CBC with slight drop in hemoglobin when compared to recent hospital discharge. Patient does report that he had black stool but no definite G.I. bleeding. DRE does have very dark stool that is strongly positive. Repeat CBC with  decrease in his hemoglobin. Concern for G.I. bleeding that is driving his tachycardia. Will start on PPI. Medicine consulted for admission for ongoing care. Labs are not consistent with DKA.     Final diagnoses:  Dehydration  Acute upper GI bleed    ED Discharge Orders     None          Griselda Norris, MD 01/18/24 [phone]

## 2024-01-18 NOTE — Consult Note (Signed)
 Referring Provider: [facility] Primary Care Physician:  Patient, No Pcp Per Primary Gastroenterologist: Unassigned  Reason for Consultation: Possible GI bleed  HPI: Carlos Mccoy is a 43 y.o. male with past medical history of hypertension and diabetes with recent admission earlier this month for DKA was discharged 2 days ago presented to the hospital with dizziness.  He was complaining of some dark stool during recent hospitalization.  Blood work this morning showed drop in hemoglobin to 10.1.  Occult blood positive.  His hemoglobin was normal during recent admission.  ALT of 53 otherwise normal LFTs.    Patient was having some intermittent black stool during previous hospitalization and also had some black stool while at home.  Denies any abdominal pain, nausea or vomiting.  Denies trouble swallowing or pain while swallowing.  Denies any bright red blood per rectum.  Occasional NSAID use.  Past Medical History:  Diagnosis Date   Diabetes mellitus without complication (HCC)    Hypertension     History reviewed. No pertinent surgical history.  Prior to Admission medications   Medication Sig Start Date End Date Taking? Authorizing Provider  acetaminophen  (TYLENOL ) 500 MG tablet Take 500 mg by mouth every 6 (six) hours as needed for pain (pain).   Yes [provider]  insulin  aspart (NOVOLOG  FLEXPEN) 100 UNIT/ML FlexPen Inject 8 Units into the skin 3 (three) times daily with meals. 01/15/24  Yes Regalado, Belkys A, MD  insulin  glargine (LANTUS ) 100 UNIT/ML Solostar Pen Inject 30 Units into the skin daily. 01/15/24  Yes Regalado, Belkys A, MD  metoprolol  succinate (TOPROL -XL) 25 MG 24 hr tablet Take 1 tablet (25 mg total) by mouth daily. 01/16/24  Yes Regalado, Belkys A, MD    Scheduled Meds:  insulin  aspart  0-15 Units Subcutaneous Q4H   insulin  glargine-yfgn  15 Units Subcutaneous Daily   metoprolol  succinate  25 mg Oral Daily   pantoprazole  (PROTONIX ) IV  40 mg Intravenous Q12H    Continuous Infusions: PRN Meds:.acetaminophen  **OR** acetaminophen , albuterol , ondansetron  **OR** ondansetron  (ZOFRAN ) IV  Allergies as of 01/18/2024   (No Known Allergies)    Family History  Problem Relation Age of Onset   Diabetes Mother    Hypertension Father     Social History   Socioeconomic History   Marital status: Married    Spouse name: Lauren   Number of children: 3   Years of education: Not on file   Highest education level: Not on file  Occupational History   Not on file  Tobacco Use   Smoking status: Former   Smokeless tobacco: Not on file  Vaping Use   Vaping status: Never Used  Substance and Sexual Activity   Alcohol use: Never   Drug use: Yes    Types: Marijuana   Sexual activity: Not on file  Other Topics Concern   Not on file  Social History Narrative   Not on file   Social Drivers of Health   Financial Resource Strain: Not on file  Food Insecurity: No Food Insecurity (01/18/2024)   Hunger Vital Sign    Worried About Running Out of Food in the Last Year: Never true    Ran Out of Food in the Last Year: Never true  Transportation Needs: No Transportation Needs (01/18/2024)   PRAPARE - Administrator, Civil Service (Medical): No    Lack of Transportation (Non-Medical): No  Physical Activity: Not on file  Stress: Not on file  Social Connections: Socially Integrated (01/18/2024)   Social  Connection and Isolation Panel    Frequency of Communication with Friends and Family: Three times a week    Frequency of Social Gatherings with Friends and Family: Three times a week    Attends Religious Services: More than 4 times per year    Active Member of Clubs or Organizations: Yes    Attends Banker Meetings: More than 4 times per year    Marital Status: Married  Catering manager Violence: Not At Risk (01/18/2024)   Humiliation, Afraid, Rape, and Kick questionnaire    Fear of Current or Ex-Partner: No    Emotionally Abused: No     Physically Abused: No    Sexually Abused: No    Review of Systems: All negative except as stated above in HPI.  Physical Exam: Vital signs: Vitals:   01/18/24 0630 01/18/24 0935  BP: (!) 141/78 134/76  Pulse: (!) 108 (!) 109  Resp: 12 20  Temp:  98.4 F (36.9 C)  SpO2: 93% 100%   Last BM Date : 01/18/24 General:   Alert,  Well-developed, well-nourished, pleasant and cooperative in NAD Normocephalic, atraumatic, extraocular movement intact No scleral icterus Lungs: No visible respiratory distress Heart:  Regular rate and rhythm; no murmurs, clicks, rubs,  or gallops. Abdomen: Soft, nontender, nondistended, bowel sound present, no peritoneal signs Mood and affect normal Alert and oriented x 3 Rectal:  Deferred  GI:  Lab Results: Recent Labs    01/18/24 0130 01/18/24 0302 01/18/24 0916  WBC 13.7* 11.6*  --   HGB 12.7* 10.9* 10.1*  HCT RESULTS UNAVAILABLE DUE TO INTERFERING SUBSTANCE 29.7* 27.6*  PLT 378 302  --    BMET Recent Labs    01/18/24 0130  NA 134*  K 3.0*  CL 99  CO2 20*  GLUCOSE 260*  BUN 23*  CREATININE 1.14  CALCIUM 9.5   LFT Recent Labs    01/18/24 0130  PROT 7.4  ALBUMIN 4.1  AST 35  ALT 53*  ALKPHOS 90  BILITOT 1.8*   PT/INR No results for input(s): LABPROT, INR in the last 72 hours.   Studies/Results: DG Chest 2 View Result Date: 01/18/2024 CLINICAL DATA:  Weakness and shortness of breath EXAM: CHEST - 2 VIEW COMPARISON:  09/10/2009 FINDINGS: Cardiac shadows within normal limits. The lungs are well aerated bilaterally. No focal infiltrate or effusion is seen. No bony abnormality is noted. IMPRESSION: No active cardiopulmonary disease. Electronically Signed   By: Oneil Devonshire M.D.   On: 01/18/2024 03:38    Impression/Plan: -Melena in the setting of recent DKA.  Likely esophagitis and gastritis.  Need to rule out peptic ulcer disease. - Acute blood loss anemia - Mild elevated ALT.  Likely  transient  Recommendations ------------------------- - Proceed with EGD today.  Risks (bleeding, infection, bowel perforation that could require surgery, sedation-related changes in cardiopulmonary systems), benefits (identification and possible treatment of source of symptoms, exclusion of certain causes of symptoms), and alternatives (watchful waiting, radiographic imaging studies, empiric medical treatment)  were explained to patient/family in detail and patient wishes to proceed.     LOS: 0 days   Layla Lah  MD, FACP 01/18/2024, 11:25 AM  Contact #  [phone]

## 2024-01-18 NOTE — Brief Op Note (Signed)
 01/18/2024  1:09 PM  PATIENT:  Carlos Mccoy  43 y.o. male  PRE-OPERATIVE DIAGNOSIS:  Melena , Anemia  POST-OPERATIVE DIAGNOSIS:  gastric ulcer, gastric biopsies  PROCEDURE:  Procedure(s): EGD (ESOPHAGOGASTRODUODENOSCOPY) (N/A)  SURGEON:  Surgeons and Role:    * Eldora Napp, MD - Primary  Findings ---------- - EGD showed 2 small prepyloric ulcers.  Clean base.  No stigmata of active bleeding.  Biopsies taken  Recommendations -------------------------- - Recommend Protonix  40 mg twice a day for 2 months -Recommend repeat EGD in 2 to 3 months to document healing of gastric ulcer -Start soft diet and advance as tolerated. -Avoid NSAIDs -Follow-up in GI clinic in 2 months after discharge.  GI will sign off.  Call us  back if needed.  Layla Lah MD, FACP 01/18/2024, 1:10 PM  Contact #  [phone]

## 2024-01-18 NOTE — Anesthesia Preprocedure Evaluation (Addendum)
 Anesthesia Evaluation  Patient identified by MRN, date of birth, ID band Patient awake    Reviewed: Allergy & Precautions, NPO status , Patient's Chart, lab work & pertinent test results  Airway Mallampati: III  TM Distance: >3 FB Neck ROM: Full    Dental  (+) Dental Advisory Given, Teeth Intact   Pulmonary former smoker   Pulmonary exam normal breath sounds clear to auscultation       Cardiovascular hypertension, Pt. on home beta blockers  Rhythm:Regular Rate:Tachycardia     Neuro/Psych negative neurological ROS     GI/Hepatic negative GI ROS, Neg liver ROS,,,  Endo/Other  diabetes, Poorly Controlled    Renal/GU negative Renal ROS     Musculoskeletal negative musculoskeletal ROS (+)    Abdominal  (+) + obese  Peds  Hematology negative hematology ROS (+)   Anesthesia Other Findings   Reproductive/Obstetrics                              Anesthesia Physical Anesthesia Plan  ASA: 3  Anesthesia Plan: MAC   Post-op Pain Management: Minimal or no pain anticipated   Induction: Intravenous  PONV Risk Score and Plan: 1 and Propofol  infusion, TIVA and Treatment may vary due to age or medical condition  Airway Management Planned: Natural Airway  Additional Equipment:   Intra-op Plan:   Post-operative Plan:   Informed Consent: I have reviewed the patients History and Physical, chart, labs and discussed the procedure including the risks, benefits and alternatives for the proposed anesthesia with the patient or authorized representative who has indicated his/her understanding and acceptance.     Dental advisory given  Plan Discussed with: CRNA  Anesthesia Plan Comments:          Anesthesia Quick Evaluation

## 2024-01-18 NOTE — Op Note (Signed)
 [facility] Patient Name: Carlos Mccoy Procedure Date: 01/18/2024 MRN: [id number] Attending MD: Layla Lah , MD, [id number] Date of Birth: 11-25-1980 CSN: [id number] Age: 43 Admit Type: Inpatient Procedure:                Upper GI endoscopy Indications:              Melena Providers:                Layla Lah, MD, Randall Lines, RN, [facility]                            [place], Technician, Nena PARAS. Dasie CRNA, CRNA Referring MD:              Medicines:                Sedation Administered by an Anesthesia Professional Complications:            No immediate complications. Estimated Blood Loss:     Estimated blood loss was minimal. Procedure:                Pre-Anesthesia Assessment:                           - Prior to the procedure, a History and Physical                            was performed, and patient medications and                            allergies were reviewed. The patient's tolerance of                            previous anesthesia was also reviewed. The risks                            and benefits of the procedure and the sedation                            options and risks were discussed with the patient.                            All questions were answered, and informed consent                            was obtained. Prior Anticoagulants: The patient has                            taken no anticoagulant or antiplatelet agents. ASA                            Grade Assessment: III - A patient with severe                            systemic disease. After reviewing the risks and  benefits, the patient was deemed in satisfactory                            condition to undergo the procedure.                           After obtaining informed consent, the endoscope was                            passed under direct vision. Throughout the                            procedure, the patient's blood pressure, pulse,  and                            oxygen saturations were monitored continuously. The                            [id number]-H190 ([id number]) Olympus endoscope was introduced                            through the mouth, and advanced to the second part                            of duodenum. The upper GI endoscopy was                            accomplished without difficulty. The patient                            tolerated the procedure well. Scope In: Scope Out: Findings:      The Z-line was regular and was found 39 cm from the incisors.      Two non-bleeding superficial gastric ulcers with no stigmata of bleeding       were found in the prepyloric region of the stomach. The largest lesion       was 6 mm in largest dimension. Biopsies were taken with a cold forceps       for histology.      The cardia and gastric fundus were normal on retroflexion.      The first portion of the duodenum and second portion of the duodenum       were normal.      Scattered mild inflammation characterized by congestion (edema) and       erythema was found in the duodenal bulb. Impression:               - Z-line regular, 39 cm from the incisors.                           - Non-bleeding gastric ulcers with no stigmata of                            bleeding. Biopsied.                           - Normal  first portion of the duodenum and second                            portion of the duodenum.                           - Duodenitis. Moderate Sedation:      Moderate (conscious) sedation was personally administered by an       anesthesia professional. The following parameters were monitored: oxygen       saturation, heart rate, blood pressure, and response to care. Recommendation:           - Return patient to hospital ward for ongoing care.                           - Soft diet.                           - Continue present medications.                           - Await pathology results.                           -  Repeat upper endoscopy in 2 months to check                            healing. Procedure Code(s):        --- Professional ---                           [phone], Esophagogastroduodenoscopy, flexible,                            transoral; with biopsy, single or multiple Diagnosis Code(s):        --- Professional ---                           K25.9, Gastric ulcer, unspecified as acute or                            chronic, without hemorrhage or perforation                           K29.80, Duodenitis without bleeding                           K92.1, Melena (includes Hematochezia) CPT copyright 2022 American Medical Association. All rights reserved. The codes documented in this report are preliminary and upon coder review may  be revised to meet current compliance requirements. Layla Lah, MD Layla Lah, MD 01/18/2024 1:08:36 PM Number of Addenda: 0

## 2024-01-18 NOTE — ED Notes (Signed)
 Pt ambulated to bathroom with no assistance. Pt denied dizziness while ambulating upon returning to room.

## 2024-01-18 NOTE — Transfer of Care (Signed)
 Immediate Anesthesia Transfer of Care Note  Patient: Carlos Mccoy  Procedure(s) Performed: EGD (ESOPHAGOGASTRODUODENOSCOPY)  Patient Location: PACU and Endoscopy Unit  Anesthesia Type:MAC  Level of Consciousness: awake, drowsy, and responds to stimulation  Airway & Oxygen Therapy: Patient Spontanous Breathing and Patient connected to face mask oxygen  Post-op Assessment: Report given to RN and Post -op Vital signs reviewed and stable  Post vital signs: Reviewed and stable  Last Vitals:  Vitals Value Taken Time  BP 117/62 01/18/24 13:05  Temp 36.7 C 01/18/24 13:05  Pulse 111 01/18/24 13:10  Resp 18 01/18/24 13:10  SpO2 100 % 01/18/24 13:10    Last Pain:  Vitals:   01/18/24 1305  TempSrc: Temporal  PainSc: Asleep         Complications: No notable events documented.

## 2024-01-18 NOTE — Inpatient Diabetes Management (Signed)
 Inpatient Diabetes Program Recommendations  AACE/ADA: New Consensus Statement on Inpatient Glycemic Control (2015)  Target Ranges:  Prepandial:   less than 140 mg/dL      Peak postprandial:   less than 180 mg/dL (1-2 hours)      Critically ill patients:  140 - 180 mg/dL   Lab Results  Component Value Date   GLUCAP 198 (H) 01/18/2024   HGBA1C 9.9 (H) 01/12/2024    Review of Glycemic Control  Diabetes history: DM2 Outpatient Diabetes medications: Lantus  30 daily, Novolog  8 TID with meals Current orders for Inpatient glycemic control: Semglee  15 daily, Novolog  0-15 Q4H  HgbA1C - 9.9% CBG 198 Recently hospitalized - new-onset DM Soft diet ordered, will need CHO mod added  Inpatient Diabetes Program Recommendations:    Add CHO-mod to soft diet  When diet advanced, add Novolog  4 units TID with meals if eating > 50%  Spoke with pt at bedside to see if everything was going ok with his diabetes and starting on insulin . Pt states he's monitoring blood sugars 3-4x/day and doing well. Has not missed any insulin  injections and blood sugars trending well. Pt is hoping to be discharged later today.  Thank you. Shona Brandy, RD, LDN, CDCES Inpatient Diabetes Coordinator [phone]

## 2024-01-18 NOTE — ED Notes (Signed)
 Unable to get access to provide serum for H&H

## 2024-01-18 NOTE — ED Triage Notes (Signed)
 Pt reports being recently discharged from [facility] on Tuesday after being diagnosed with Type 1 Diabetes per patient. Since discharge patient reports feeling dizzy (worse when standing, not improving) along with weakness, fatigue. Pt reports Glucose was in 180s earlier today but has been running in the 200-300 range primarily since discharge. Decreased oral intake reported. Denies N/V/D, fever. Pt awake and alert, NAD noted.

## 2024-01-18 NOTE — H&P (Signed)
 History and Physical  Carlos Mccoy FMW:[id number] DOB: 04/24/1981 DOA: 01/18/2024  PCP: Patient, No Pcp Per   Chief Complaint: Dizziness  HPI: Carlos Mccoy is a 43 y.o. male with medical history significant for hypertension, type 2 diabetes who was just discharged from [facility] on 8/13 for a stay for DKA who is now being admitted to the hospital with melena and blood loss anemia.  Patient states that while he was in the hospital, he did have dark stools, did not think much of it as he was on new medications and thought it may be related to that.  When he got home, he noticed that he felt very lethargic, incredibly dizzy.  No chest pain, no bright red bleeding, no nausea or vomiting.  No abdominal pain, however he continued to have black tarry stools.  He has never had this before, has no prior history of GI bleeding, no prior history of EGD, colonoscopy, peptic ulcer disease or severe GERD.  Review of Systems: Please see HPI for pertinent positives and negatives. A complete 10 system review of systems are otherwise negative.  Past Medical History:  Diagnosis Date   Diabetes mellitus without complication (HCC)    Hypertension    History reviewed. No pertinent surgical history. Social History:  reports that he has quit smoking. He does not have any smokeless tobacco history on file. He reports that he does not drink alcohol and does not use drugs.  No Known Allergies  Family History  Problem Relation Age of Onset   Hypertension Father      Prior to Admission medications   Medication Sig Start Date End Date Taking? Authorizing Provider  acetaminophen  (TYLENOL ) 500 MG tablet Take 500 mg by mouth every 6 (six) hours as needed for pain (pain).   Yes [provider]  insulin  aspart (NOVOLOG  FLEXPEN) 100 UNIT/ML FlexPen Inject 8 Units into the skin 3 (three) times daily with meals. 01/15/24  Yes Regalado, Belkys A, MD  insulin  glargine (LANTUS ) 100 UNIT/ML Solostar Pen Inject 30 Units  into the skin daily. 01/15/24  Yes Regalado, Belkys A, MD  metoprolol  succinate (TOPROL -XL) 25 MG 24 hr tablet Take 1 tablet (25 mg total) by mouth daily. 01/16/24  Yes Regalado, Owen LABOR, MD    Physical Exam: BP (!) 141/78   Pulse (!) 108   Temp 98.6 F (37 C) (Oral)   Resp 12   Ht 6' 1 (1.854 m)   Wt 108.9 kg   SpO2 93%   BMI 31.66 kg/m  General:  Alert, oriented, calm, in no acute distress, resting comfortably Cardiovascular: RRR, no murmurs or rubs, no peripheral edema  Respiratory: clear to auscultation bilaterally, no wheezes, no crackles  Abdomen: soft, nontender, nondistended, normal bowel tones heard  Skin: dry, no rashes  Musculoskeletal: no joint effusions, normal range of motion  Psychiatric: appropriate affect, normal speech  Neurologic: extraocular muscles intact, clear speech, moving all extremities with intact sensorium         Labs on Admission:  Basic Metabolic Panel: Recent Labs  Lab 01/12/24 1542 01/13/24 0002 01/13/24 0244 01/13/24 0807 01/14/24 0516 01/18/24 0130 01/18/24 0315  NA 132* 133* 133* 136 134* 134*  --   K 4.3 3.1* 3.1* 3.4* 3.6 3.0*  --   CL 94* 94* 97* 98 100 99  --   CO2 21* 23 22 24  21* 20*  --   GLUCOSE 410* 182* 169* 186* 275* 260*  --   BUN 20 17 16 15 18  23*  --  CREATININE 1.34* 1.10 1.00 0.98 0.99 1.14  --   CALCIUM  10.1 9.5 9.3 9.6 9.1 9.5  --   MG 2.3  --   --   --   --   --  1.8  PHOS 4.1  --   --   --   --   --   --    Liver Function Tests: Recent Labs  Lab 01/12/24 1142 01/13/24 0244 01/13/24 0807 01/14/24 0516 01/18/24 0130  AST 19 17 20 25  35  ALT 31 23 24 27  53*  ALKPHOS 137* 89 89 90 90  BILITOT 3.0* 2.9* 2.7* 2.0* 1.8*  PROT 9.3* 6.8 6.7 6.1* 7.4  ALBUMIN 5.1* 4.0 3.8 3.5 4.1   No results for input(s): LIPASE, AMYLASE in the last 168 hours. No results for input(s): AMMONIA in the last 168 hours. CBC: Recent Labs  Lab 01/12/24 1138 01/12/24 1142 01/13/24 0244 01/18/24 0130 01/18/24 0302   WBC  --  12.0* 10.3 13.7* 11.6*  NEUTROABS  --   --   --  7.7 7.1  HGB 18.7* 18.2* 15.6 12.7* 10.9*  HCT 55.0* 50.8 42.2 RESULTS UNAVAILABLE DUE TO INTERFERING SUBSTANCE 29.7*  MCV  --  85.2 85.3 RESULTS UNAVAILABLE DUE TO INTERFERING SUBSTANCE 83.4  PLT  --  362 297 378 302   Cardiac Enzymes: Recent Labs  Lab 01/12/24 1142  CKTOTAL 121   BNP (last 3 results) No results for input(s): BNP in the last 8760 hours.  ProBNP (last 3 results) No results for input(s): PROBNP in the last 8760 hours.  CBG: Recent Labs  Lab 01/14/24 1655 01/14/24 2049 01/15/24 0725 01/15/24 1132 01/18/24 0120  GLUCAP 255* 215* 213* 289* 259*    Radiological Exams on Admission: DG Chest 2 View Result Date: 01/18/2024 CLINICAL DATA:  Weakness and shortness of breath EXAM: CHEST - 2 VIEW COMPARISON:  09/10/2009 FINDINGS: Cardiac shadows within normal limits. The lungs are well aerated bilaterally. No focal infiltrate or effusion is seen. No bony abnormality is noted. IMPRESSION: No active cardiopulmonary disease. Electronically Signed   By: Oneil Devonshire M.D.   On: 01/18/2024 03:38   Assessment/Plan Carlos Mccoy is a 43 y.o. male with medical history significant for hypertension, type 2 diabetes who was just discharged from [facility] on 8/13 for a stay for DKA who is now being admitted to the hospital with melena and blood loss anemia.   Upper GI bleed-suspected due to melena, no prior history of peptic ulcer disease, GI bleeding, has not had EGD or colonoscopy in the past -Inpatient admission -Monitor closely on progressive unit -Keep n.p.o. for now -Avoid blood thinners -Trend hemoglobin every 8 hours -Discussed with GI Dr. Elicia who will see in consultation today -Continue IV PPI twice daily  Acute blood loss anemia-in the setting of melena, his hemoglobin has dropped in 3 days from 16 to 11 -As above  Type 2 diabetes-recently diagnosed earlier this month when he was admitted to the  hospital with DKA -Continue basal insulin  Lantus  at reduced dose of 15 units daily -Moderate dose sliding scale -Carb modified diet once safe to advance  Hypertension-on Toprol -XL for the time being, will need to be placed on ACE inhibitor as an outpatient if renal function remains stable -Continue Toprol -XL  DVT prophylaxis: SCDs    Code Status: Full Code  Consults called: GI Dr. Elicia  Admission status: The appropriate patient status for this patient is INPATIENT. Inpatient status is judged to be reasonable and necessary in order to  provide the required intensity of service to ensure the patient's safety. The patient's presenting symptoms, physical exam findings, and initial radiographic and laboratory data in the context of their chronic comorbidities is felt to place them at high risk for further clinical deterioration. Furthermore, it is not anticipated that the patient will be medically stable for discharge from the hospital within 2 midnights of admission.    I certify that at the point of admission it is my clinical judgment that the patient will require inpatient hospital care spanning beyond 2 midnights from the point of admission due to high intensity of service, high risk for further deterioration and high frequency of surveillance required  Time spent: 56 minutes  Trampus Mcquerry CHRISTELLA Gail MD [facility] Pager [phone]  If 7PM-7AM, please contact night-coverage [email or web address] Password [facility]  01/18/2024, 8:39 AM

## 2024-01-19 DIAGNOSIS — K922 Gastrointestinal hemorrhage, unspecified: Secondary | ICD-10-CM | POA: Diagnosis not present

## 2024-01-19 LAB — BASIC METABOLIC PANEL WITH GFR
Anion gap: 8 (ref 5–15)
BUN: 13 mg/dL (ref 6–20)
CO2: 24 mmol/L (ref 22–32)
Calcium: 9 mg/dL (ref 8.9–10.3)
Chloride: 105 mmol/L (ref 98–111)
Creatinine, Ser: 1.21 mg/dL (ref 0.61–1.24)
GFR, Estimated: >60 mL/min (ref >60)
Glucose, Bld: 145 mg/dL — ABNORMAL HIGH (ref 70–99)
Potassium: 2.9 mmol/L — ABNORMAL LOW (ref 3.5–5.1)
Sodium: 137 mmol/L (ref 135–145)

## 2024-01-19 LAB — HEMOGLOBIN AND HEMATOCRIT, BLOOD
HCT: 26.9 % — ABNORMAL LOW (ref 39.0–52.0)
HCT: 27.3 % — ABNORMAL LOW (ref 39.0–52.0)
Hemoglobin: 9.5 g/dL — ABNORMAL LOW (ref 13.0–17.0)
Hemoglobin: 9.7 g/dL — ABNORMAL LOW (ref 13.0–17.0)

## 2024-01-19 LAB — GLUCOSE, CAPILLARY
Glucose-Capillary: 112 mg/dL — ABNORMAL HIGH (ref 70–99)
Glucose-Capillary: 132 mg/dL — ABNORMAL HIGH (ref 70–99)
Glucose-Capillary: 133 mg/dL — ABNORMAL HIGH (ref 70–99)
Glucose-Capillary: 133 mg/dL — ABNORMAL HIGH (ref 70–99)
Glucose-Capillary: 136 mg/dL — ABNORMAL HIGH (ref 70–99)
Glucose-Capillary: 143 mg/dL — ABNORMAL HIGH (ref 70–99)
Glucose-Capillary: 273 mg/dL — ABNORMAL HIGH (ref 70–99)

## 2024-01-19 MED ORDER — POTASSIUM CHLORIDE 20 MEQ PO PACK
60.0000 meq | PACK | Freq: Once | ORAL | Status: AC
  Administered 2024-01-19: 60 meq via ORAL
  Filled 2024-01-19: qty 3

## 2024-01-19 NOTE — Progress Notes (Signed)
 PROGRESS NOTE    Carlos Mccoy  FMW:[id number] DOB: Mar 03, 1981 DOA: 01/18/2024 PCP: Patient, No Pcp Per   Brief Narrative:   43 y.o. male with medical history significant for hypertension, type 2 diabetes who was just discharged from [facility] on 8/13 for a stay for DKA who is now being admitted to the hospital with melena and blood loss anemia, s/p EGD done on 8/15 which showed 2 prepyloric ulcers and recommendation is twice daily PPI and repeat EGD in 2-3 months.  Assessment & Plan:  Principal Problem:   Upper GI bleed   43 y.o. male with medical history significant for hypertension, type 2 diabetes who was just discharged from [facility] on 8/13 for a stay for DKA who was admitted to the hospital with melena and blood loss anemia.    Upper GI bleed-suspected due to melena, no prior history of peptic ulcer disease, GI bleeding, has not had EGD or colonoscopy in the past s/p EGD done on 8/15 which showed 2 prepyloric ulcers. Needs repeat EGD in 2-3 months. -Avoid blood thinners -Trend hemoglobin  -Continue oral PPI twice daily   Acute blood loss anemia-in the setting of melena, his hemoglobin has dropped in 3 days from 16 to 11 -Hb stable now. No need for transfusions.   Type 2 diabetes mellitus:-recently diagnosed earlier this month when he was admitted to the hospital with DKA -Continue basal insulin  Lantus  at reduced dose of 15 units daily -Moderate dose sliding scale   Hypertension- -Continue Toprol -XL  Disposition: Home. IADL   DVT prophylaxis: SCDs Start: 01/18/24 9171     Code Status: Full Code Family Communication:   Status is: Inpatient Remains inpatient appropriate because: Melena    Subjective:  He had one melanotic stool this morning. Denies any active complaints.  Examination:  General exam: Appears calm and comfortable  Respiratory system: Clear to auscultation. Respiratory effort normal. Cardiovascular system: S1 & S2 heard, RRR. No JVD, murmurs, rubs, gallops  or clicks. No pedal edema. Gastrointestinal system: Abdomen is nondistended, soft and nontender. No organomegaly or masses felt. Normal bowel sounds heard. Central nervous system: Alert and oriented. No focal neurological deficits. Extremities: Symmetric 5 x 5 power. Skin: No rashes, lesions or ulcers Psychiatry: Judgement and insight appear normal. Mood & affect appropriate.     Diet Orders (From admission, onward)     Start     Ordered   01/18/24 1340  DIET SOFT Room service appropriate? Yes; Fluid consistency: Thin  Diet effective now       Question Answer Comment  Room service appropriate? Yes   Fluid consistency: Thin      01/18/24 1339            Objective: Vitals:   01/18/24 1448 01/18/24 1723 01/18/24 2130 01/19/24 0507  BP: 137/84 (!) 140/86 122/78 135/71  Pulse: 100 (!) 104 98 (!) 102  Resp: 20 19 16 18   Temp: 98 F (36.7 C) 98.8 F (37.1 C) 97.8 F (36.6 C) 98.2 F (36.8 C)  TempSrc: Oral Oral Oral Oral  SpO2: 100% 100% 100% 100%  Weight:      Height:        Intake/Output Summary (Last 24 hours) at 01/19/2024 1015 Last data filed at 01/19/2024 0900 Gross per 24 hour  Intake 1063.15 ml  Output --  Net 1063.15 ml   Filed Weights   01/18/24 0121 01/18/24 1213  Weight: 108.9 kg 108.9 kg    Scheduled Meds:  insulin  aspart  0-15 Units Subcutaneous Q4H  insulin  glargine-yfgn  15 Units Subcutaneous Daily   metoprolol  succinate  25 mg Oral Daily   pantoprazole   40 mg Oral BID   Continuous Infusions:  Nutritional status     Body mass index is 31.67 kg/m.  Data Reviewed:   CBC: Recent Labs  Lab 01/12/24 1142 01/13/24 0244 01/18/24 0130 01/18/24 0302 01/18/24 0916 01/18/24 1620 01/18/24 2355  WBC 12.0* 10.3 13.7* 11.6*  --   --   --   NEUTROABS  --   --  7.7 7.1  --   --   --   HGB 18.2* 15.6 12.7* 10.9* 10.1* 9.7* 9.7*  HCT 50.8 42.2 RESULTS UNAVAILABLE DUE TO INTERFERING SUBSTANCE 29.7* 27.6* 26.0* 26.9*  MCV 85.2 85.3 RESULTS  UNAVAILABLE DUE TO INTERFERING SUBSTANCE 83.4  --   --   --   PLT 362 297 378 302  --   --   --    Basic Metabolic Panel: Recent Labs  Lab 01/12/24 1542 01/13/24 0002 01/13/24 0244 01/13/24 0807 01/14/24 0516 01/18/24 0130 01/18/24 0315 01/18/24 2355  NA 132*   < > 133* 136 134* 134*  --  137  K 4.3   < > 3.1* 3.4* 3.6 3.0*  --  2.9*  CL 94*   < > 97* 98 100 99  --  105  CO2 21*   < > 22 24 21* 20*  --  24  GLUCOSE 410*   < > 169* 186* 275* 260*  --  145*  BUN 20   < > 16 15 18  23*  --  13  CREATININE 1.34*   < > 1.00 0.98 0.99 1.14  --  1.21  CALCIUM 10.1   < > 9.3 9.6 9.1 9.5  --  9.0  MG 2.3  --   --   --   --   --  1.8  --   PHOS 4.1  --   --   --   --   --   --   --    < > = values in this interval not displayed.   GFR: Estimated Creatinine Clearance: 104 mL/min (by C-G formula based on SCr of 1.21 mg/dL). Liver Function Tests: Recent Labs  Lab 01/12/24 1142 01/13/24 0244 01/13/24 0807 01/14/24 0516 01/18/24 0130  AST 19 17 20 25  35  ALT 31 23 24 27  53*  ALKPHOS 137* 89 89 90 90  BILITOT 3.0* 2.9* 2.7* 2.0* 1.8*  PROT 9.3* 6.8 6.7 6.1* 7.4  ALBUMIN 5.1* 4.0 3.8 3.5 4.1   No results for input(s): LIPASE, AMYLASE in the last 168 hours. No results for input(s): AMMONIA in the last 168 hours. Coagulation Profile: No results for input(s): INR, PROTIME in the last 168 hours. Cardiac Enzymes: Recent Labs  Lab 01/12/24 1142  CKTOTAL 121   BNP (last 3 results) No results for input(s): PROBNP in the last 8760 hours. HbA1C: No results for input(s): HGBA1C in the last 72 hours. CBG: Recent Labs  Lab 01/18/24 1602 01/18/24 2132 01/19/24 0050 01/19/24 0504 01/19/24 0729  GLUCAP 156* 178* 143* 133* 136*   Lipid Profile: No results for input(s): CHOL, HDL, LDLCALC, TRIG, CHOLHDL, LDLDIRECT in the last 72 hours. Thyroid Function Tests: No results for input(s): TSH, T4TOTAL, FREET4, T3FREE, THYROIDAB in the last 72  hours. Anemia Panel: No results for input(s): VITAMINB12, FOLATE, FERRITIN, TIBC, IRON, RETICCTPCT in the last 72 hours. Sepsis Labs: No results for input(s): PROCALCITON, LATICACIDVEN in the last 168 hours.  Recent Results (from the past 240 hours)  MRSA Next Gen by PCR, Nasal     Status: None   Collection Time: 01/12/24  5:34 PM   Specimen: Nasal Mucosa; Nasal Swab  Result Value Ref Range Status   MRSA by PCR Next Gen NOT DETECTED NOT DETECTED Final    Comment: (NOTE) The GeneXpert MRSA Assay (FDA approved for NASAL specimens only), is one component of a comprehensive MRSA colonization surveillance program. It is not intended to diagnose MRSA infection nor to guide or monitor treatment for MRSA infections. Test performance is not FDA approved in patients less than 66 years old. Performed at [facility], [address], [place], [place] [address]          Radiology Studies: DG Chest 2 View Result Date: 01/18/2024 CLINICAL DATA:  Weakness and shortness of breath EXAM: CHEST - 2 VIEW COMPARISON:  09/10/2009 FINDINGS: Cardiac shadows within normal limits. The lungs are well aerated bilaterally. No focal infiltrate or effusion is seen. No bony abnormality is noted. IMPRESSION: No active cardiopulmonary disease. Electronically Signed   By: Oneil Devonshire M.D.   On: 01/18/2024 03:38        LOS: 1 day   Time spent= 40 mins    Deliliah Room, MD [facility]  If 7PM-7AM, please contact night-coverage  01/19/2024, 10:15 AM

## 2024-01-20 DIAGNOSIS — K922 Gastrointestinal hemorrhage, unspecified: Secondary | ICD-10-CM | POA: Diagnosis not present

## 2024-01-20 LAB — CBC
HCT: 27.1 % — ABNORMAL LOW (ref 39.0–52.0)
Hemoglobin: 9.8 g/dL — ABNORMAL LOW (ref 13.0–17.0)
MCH: 31.7 pg (ref 26.0–34.0)
MCHC: 36.2 g/dL — ABNORMAL HIGH (ref 30.0–36.0)
MCV: 87.7 fL (ref 80.0–100.0)
Platelets: 333 K/uL (ref 150–400)
RBC: 3.09 MIL/uL — ABNORMAL LOW (ref 4.22–5.81)
RDW: 11.7 % (ref 11.5–15.5)
WBC: 11 K/uL — ABNORMAL HIGH (ref 4.0–10.5)
nRBC: 0 % (ref 0.0–0.2)

## 2024-01-20 LAB — GLUCOSE, CAPILLARY
Glucose-Capillary: 109 mg/dL — ABNORMAL HIGH (ref 70–99)
Glucose-Capillary: 146 mg/dL — ABNORMAL HIGH (ref 70–99)

## 2024-01-20 MED ORDER — INSULIN GLARGINE 100 UNIT/ML SOLOSTAR PEN: 20.0000 [IU] | PEN_INJECTOR | Freq: Every day | SUBCUTANEOUS | 11 refills | Status: DC

## 2024-01-20 MED ORDER — PANTOPRAZOLE SODIUM 40 MG PO TBEC: 40.0000 mg | DELAYED_RELEASE_TABLET | Freq: Two times a day (BID) | ORAL | 0 refills | Status: AC

## 2024-01-20 NOTE — Discharge Summary (Signed)
 Physician Discharge Summary   Patient: Carlos Mccoy MRN: [id number] DOB: 23-Aug-1980  Admit date:     01/18/2024  Discharge date: 01/20/24  Discharge Physician: Deliliah Room   PCP: Patient, No Pcp Per   Recommendations at discharge:    Follow up with your PCP in one week Follow up with GI in 2 months. You need a repeat EGD in 2-3 months Avoid taking aspirin, ibuprofen , advil , motrin  etc. You can take tylenol  for pain. Avoid fatty/spicy foods  Discharge Diagnoses: Principal Problem:   Upper GI bleed   Hospital Course:  43 y.o. male with medical history significant for hypertension, type 2 diabetes who was just discharged from [facility] on 8/13 for a stay for DKA, admitted to the hospital with melena and blood loss anemia, s/p EGD done on 8/15 which showed 2 prepyloric ulcers and recommendation is twice daily PPI and repeat EGD in 2-3 months.No evidence of melena or hematochezia on discharge. Hemoglobin is stable. F/u with GI in two months. Advised to avoid NSAIDs and fatty/spicy foods.      Consultants: GI Procedures performed: EGD  Disposition: Home Diet recommendation:  Carb controlled DISCHARGE MEDICATION: Allergies as of 01/20/2024   No Known Allergies      Medication List     TAKE these medications    acetaminophen  500 MG tablet Commonly known as: TYLENOL  Take 500 mg by mouth every 6 (six) hours as needed for pain (pain).   insulin  glargine 100 UNIT/ML Solostar Pen Commonly known as: LANTUS  Inject 20 Units into the skin daily. What changed: how much to take   metoprolol  succinate 25 MG 24 hr tablet Commonly known as: TOPROL -XL Take 1 tablet (25 mg total) by mouth daily.   NovoLOG  FlexPen 100 UNIT/ML FlexPen Generic drug: insulin  aspart Inject 8 Units into the skin 3 (three) times daily with meals.   pantoprazole  40 MG tablet Commonly known as: PROTONIX  Take 1 tablet (40 mg total) by mouth 2 (two) times daily.        Follow-up Information      Brahmbhatt, Parag, MD. Schedule an appointment as soon as possible for a visit in 2 month(s).   Specialty: Gastroenterology Why: Follow-up for gastric ulcer and to schedule repeat endoscopy Contact information: [address] [place] [address] [phone]                Discharge Exam: Fredricka Weights   01/18/24 0121 01/18/24 1213  Weight: 108.9 kg 108.9 kg   Constitutional: NAD, calm, comfortable Eyes: PERRL, lids and conjunctivae normal ENMT: Mucous membranes are moist. Posterior pharynx clear of any exudate or lesions.Normal dentition.  Neck: normal, supple, no masses, no thyromegaly Respiratory: clear to auscultation bilaterally, no wheezing, no crackles. Normal respiratory effort. No accessory muscle use.  Cardiovascular: Regular rate and rhythm, no murmurs / rubs / gallops. No extremity edema. 2+ pedal pulses. No carotid bruits.  Abdomen: no tenderness, no masses palpated. No hepatosplenomegaly. Bowel sounds positive.  Musculoskeletal: no clubbing / cyanosis. No joint deformity upper and lower extremities. Good ROM, no contractures. Normal muscle tone.  Skin: no rashes, lesions, ulcers. No induration Neurologic: CN 2-12 grossly intact. Sensation intact, DTR normal. Strength 5/5 x all 4 extremities.  Psychiatric: Normal judgment and insight. Alert and oriented x 3. Normal mood.    Condition at discharge: good  The results of significant diagnostics from this hospitalization (including imaging, microbiology, ancillary and laboratory) are listed below for reference.   Imaging Studies: DG Chest 2 View Result  Date: 01/18/2024 CLINICAL DATA:  Weakness and shortness of breath EXAM: CHEST - 2 VIEW COMPARISON:  09/10/2009 FINDINGS: Cardiac shadows within normal limits. The lungs are well aerated bilaterally. No focal infiltrate or effusion is seen. No bony abnormality is noted. IMPRESSION: No active cardiopulmonary disease. Electronically Signed   By: Oneil Devonshire  M.D.   On: 01/18/2024 03:38    Microbiology: Results for orders placed or performed during the hospital encounter of 01/12/24  MRSA Next Gen by PCR, Nasal     Status: None   Collection Time: 01/12/24  5:34 PM   Specimen: Nasal Mucosa; Nasal Swab  Result Value Ref Range Status   MRSA by PCR Next Gen NOT DETECTED NOT DETECTED Final    Comment: (NOTE) The GeneXpert MRSA Assay (FDA approved for NASAL specimens only), is one component of a comprehensive MRSA colonization surveillance program. It is not intended to diagnose MRSA infection nor to guide or monitor treatment for MRSA infections. Test performance is not FDA approved in patients less than 83 years old. Performed at [facility], [address], [place], [place] [address]     Labs: CBC: Recent Labs  Lab 01/18/24 0130 01/18/24 0302 01/18/24 0916 01/18/24 1620 01/18/24 2355 01/19/24 0957 01/20/24 0312  WBC 13.7* 11.6*  --   --   --   --  11.0*  NEUTROABS 7.7 7.1  --   --   --   --   --   HGB 12.7* 10.9* 10.1* 9.7* 9.7* 9.5* 9.8*  HCT RESULTS UNAVAILABLE DUE TO INTERFERING SUBSTANCE 29.7* 27.6* 26.0* 26.9* 27.3* 27.1*  MCV RESULTS UNAVAILABLE DUE TO INTERFERING SUBSTANCE 83.4  --   --   --   --  87.7  PLT 378 302  --   --   --   --  333   Basic Metabolic Panel: Recent Labs  Lab 01/14/24 0516 01/18/24 0130 01/18/24 0315 01/18/24 2355  NA 134* 134*  --  137  K 3.6 3.0*  --  2.9*  CL 100 99  --  105  CO2 21* 20*  --  24  GLUCOSE 275* 260*  --  145*  BUN 18 23*  --  13  CREATININE 0.99 1.14  --  1.21  CALCIUM 9.1 9.5  --  9.0  MG  --   --  1.8  --    Liver Function Tests: Recent Labs  Lab 01/14/24 0516 01/18/24 0130  AST 25 35  ALT 27 53*  ALKPHOS 90 90  BILITOT 2.0* 1.8*  PROT 6.1* 7.4  ALBUMIN 3.5 4.1   CBG: Recent Labs  Lab 01/19/24 1622 01/19/24 1959 01/19/24 2340 01/20/24 0349 01/20/24 0757  GLUCAP 112* 273* 133* 109* 146*    Discharge time spent: 42  minutes.  Signed: Deliliah Room, MD [facility] 01/20/2024

## 2024-01-20 NOTE — Plan of Care (Signed)
  Problem: Education: Goal: Ability to describe self-care measures that may prevent or decrease complications (Diabetes Survival Skills Education) will improve Outcome: Progressing   Problem: Coping: Goal: Ability to adjust to condition or change in health will improve Outcome: Progressing   Problem: Safety: Goal: Ability to remain free from injury will improve Outcome: Progressing

## 2024-01-21 NOTE — Anesthesia Postprocedure Evaluation (Signed)
 Anesthesia Post Note  Patient: Carlos Mccoy  Procedure(s) Performed: EGD (ESOPHAGOGASTRODUODENOSCOPY)     Patient location during evaluation: PACU Anesthesia Type: MAC Level of consciousness: awake and alert Pain management: pain level controlled Vital Signs Assessment: post-procedure vital signs reviewed and stable Respiratory status: spontaneous breathing Cardiovascular status: stable Anesthetic complications: no   No notable events documented.  Last Vitals:  Vitals:   01/19/24 2126 01/20/24 0605  BP: 102/66 124/80  Pulse: 95 72  Resp: 18 (!) 22  Temp: 36.8 C 36.5 C  SpO2: 100% 100%    Last Pain:  Vitals:   01/20/24 0727  TempSrc:   PainSc: 0-No pain                 Norleen Pope

## 2024-01-22 ENCOUNTER — Encounter (HOSPITAL_COMMUNITY): Payer: Self-pay | Admitting: Gastroenterology

## 2024-01-22 LAB — SURGICAL PATHOLOGY

## 2024-01-25 ENCOUNTER — Ambulatory Visit (INDEPENDENT_AMBULATORY_CARE_PROVIDER_SITE_OTHER): Admitting: Nurse Practitioner

## 2024-01-25 VITALS — BP 123/75 | HR 96 | Ht 71.0 in | Wt 243.0 lb

## 2024-01-25 DIAGNOSIS — E876 Hypokalemia: Secondary | ICD-10-CM | POA: Insufficient documentation

## 2024-01-25 DIAGNOSIS — E66812 Obesity, class 2: Secondary | ICD-10-CM

## 2024-01-25 DIAGNOSIS — D62 Acute posthemorrhagic anemia: Secondary | ICD-10-CM

## 2024-01-25 DIAGNOSIS — I10 Essential (primary) hypertension: Secondary | ICD-10-CM

## 2024-01-25 DIAGNOSIS — Z794 Long term (current) use of insulin: Secondary | ICD-10-CM

## 2024-01-25 DIAGNOSIS — E1165 Type 2 diabetes mellitus with hyperglycemia: Secondary | ICD-10-CM

## 2024-01-25 DIAGNOSIS — Z23 Encounter for immunization: Secondary | ICD-10-CM | POA: Diagnosis not present

## 2024-01-25 DIAGNOSIS — D649 Anemia, unspecified: Secondary | ICD-10-CM | POA: Insufficient documentation

## 2024-01-25 DIAGNOSIS — E785 Hyperlipidemia, unspecified: Secondary | ICD-10-CM | POA: Insufficient documentation

## 2024-01-25 DIAGNOSIS — K922 Gastrointestinal hemorrhage, unspecified: Secondary | ICD-10-CM | POA: Diagnosis not present

## 2024-01-25 MED ORDER — FREESTYLE LIBRE 3 PLUS SENSOR MISC: 3 refills | Status: DC

## 2024-01-25 NOTE — Progress Notes (Signed)
 D:M NOT CURRENTLY A:N S: NOT CURTELY  C/T: N V:N

## 2024-01-25 NOTE — Assessment & Plan Note (Addendum)
 Lab Results  Component Value Date   CHOL 212 (H) 01/12/2024   HDL 29 (L) 01/12/2024   LDLCALC 133 (H) 01/12/2024   TRIG 251 (H) 01/12/2024   CHOLHDL 7.3 01/12/2024  Controlled on metoprolol  25 mg daily Counseled on heart healthy diet

## 2024-01-25 NOTE — Assessment & Plan Note (Signed)
 Lab Results  Component Value Date   NA 137 01/18/2024   K 2.9 (L) 01/18/2024   CO2 24 01/18/2024   GLUCOSE 145 (H) 01/18/2024   BUN 13 01/18/2024   CREATININE 1.21 01/18/2024   CALCIUM 9.0 01/18/2024   GFRNONAA >60 01/18/2024   - Recheck potassium levels.

## 2024-01-25 NOTE — Assessment & Plan Note (Addendum)
 Lab Results  Component Value Date   CHOL 212 (H) 01/12/2024   HDL 29 (L) 01/12/2024   LDLCALC 133 (H) 01/12/2024   TRIG 251 (H) 01/12/2024   CHOLHDL 7.3 01/12/2024   - will start rosuvastatin 10 mg daily pending labs to recheck hepatic panel

## 2024-01-25 NOTE — Assessment & Plan Note (Signed)
 Lab Results  Component Value Date   WBC 11.0 (H) 01/20/2024   HGB 9.8 (L) 01/20/2024   HCT 27.1 (L) 01/20/2024   MCV 87.7 01/20/2024   PLT 333 01/20/2024   Hemoglobin at 9.8 g/dL. - Recheck CBC to monitor hemoglobin levels.

## 2024-01-25 NOTE — Assessment & Plan Note (Addendum)
 Lab Results  Component Value Date   HGBA1C 9.9 (H) 01/12/2024    Type 2 diabetes mellitus, insulin  requiring Recently diagnosed post-DKA, on insulin  therapy. A1c at 9.9%. Discussed eye exams and Freestyle Libre for glucose management. - Continue Lantus  20 units daily and Novolog  8 units with meals. - Reschedule missed appointment with endocrinologist. - Order Freestyle Libre for continuous glucose monitoring. - Refer for annual eye exam. - Encourage lifestyle modifications including diet and exercise. - Educate on daily foot exams for neuropathy prevention, diabetic foot exam completed Advised to report hypoglycemia

## 2024-01-25 NOTE — Patient Instructions (Signed)
 Goal for fasting blood sugar ranges from 80 to 120 and 2 hours after any meal or at bedtime should be between 130 to 170.    1. Upper GI bleed (Primary)  - CBC  2. Hyperbilirubinemia  - Hepatic Function Panel  3. Class 2 obesity  4. Uncontrolled type 2 diabetes mellitus with hyperglycemia, with long-term current use of insulin  (HCC)  - Microalbumin / creatinine urine ratio - Basic Metabolic Panel - Continuous Glucose Sensor (FREESTYLE LIBRE 3 PLUS SENSOR) MISC; Use as directed.Change sensor every 15 days.  Dispense: 6 each; Refill: 3 - Ambulatory referral to Ophthalmology  5. Primary hypertension   6. Dyslipidemia, goal LDL below 70   7. Need for Tdap vaccination   8. Need for pneumococcal 20-valent conjugate vaccination   It is important that you exercise regularly at least 30 minutes 5 times a week as tolerated  Think about what you will eat, plan ahead. Choose  clean, green, fresh or frozen over canned, processed or packaged foods which are more sugary, salty and fatty. 70 to 75% of food eaten should be vegetables and fruit. Three meals at set times with snacks allowed between meals, but they must be fruit or vegetables. Aim to eat over a 12 hour period , example 7 am to 7 pm, and STOP after  your last meal of the day. Drink water,generally about 64 ounces per day, no other drink is as healthy. Fruit juice is best enjoyed in a healthy way, by EATING the fruit.  Thanks for choosing [facility] we consider it a privelige to serve you.

## 2024-01-25 NOTE — Assessment & Plan Note (Signed)
 Patient educated on CDC recommendation for the vaccine. Verbal consent was obtained from the patient, vaccine administered by nurse, no sign of adverse reactions noted at this time. Patient education on arm soreness and use of tylenol  for this patient  was discussed. Patient educated on the signs and symptoms of adverse effect and advise to contact the office if they occur.  ?

## 2024-01-25 NOTE — Assessment & Plan Note (Signed)
 Pre-pyloric gastric ulcers, on PPI therapy EGD showed two ulcers. On pantoprazole , advised to avoid NSAIDs. - Continue pantoprazole  40 mg twice daily. - Schedule follow-up EGD in two months.

## 2024-01-25 NOTE — Progress Notes (Signed)
 New Patient Office Visit  Subjective:  Patient ID: Carlos Mccoy, male    DOB: 09-26-1980  Age: 43 y.o. MRN: [id number]  CC: No chief complaint on file.   HPI   Discussed the use of AI scribe software for clinical note transcription with the patient, who gave verbal consent to proceed.  History of Present Illness Carlos Mccoy is a 42 year old male with diabetes, hypertension, and pre-pyloric ulcers who presents to establish care.  He was hospitalized twice this month, initially for diabetic ketoacidosis, which led to his diagnosis of diabetes. He is currently on insulin  therapy, taking 20 units of Lantus  daily and 8 units of Novolog  with meals. Since his second hospitalization, his blood sugar levels have ranged from 87 to 125 mg/dL. He has made lifestyle changes, including avoiding sugar, eating healthier, and drinking water. He experienced dizziness upon standing, which prompted his second hospitalization. His last recorded A1c was 9.9%.  He was also hospitalized recently for dehydration  and upper GI bleeding form 01/18/2024 to 01/20/2024. An EGD performed on August 15 revealed two pre-pyloric ulcers, and he was prescribed pantoprazole  40 mg twice daily. He reports no current symptoms of bleeding or stomach pain.  He is on metoprolol  25 mg daily for hypertension. No current symptoms of dizziness or calf pain when walking.  In terms of family history, his mother and maternal grandmother have diabetes, and his father has hypertension. He is married with three children and has a history of marijuana use but has not used it recently. He does not consume alcohol or tobacco. He has not been sexually active recently.     Past Medical History:  Diagnosis Date   Diabetes mellitus without complication (HCC)    Hypertension     Past Surgical History:  Procedure Laterality Date   ESOPHAGOGASTRODUODENOSCOPY N/A 01/18/2024   Procedure: EGD (ESOPHAGOGASTRODUODENOSCOPY);  Surgeon:  Elicia Claw, MD;  Location: [facility];  Service: Gastroenterology;  Laterality: N/A;    Family History  Problem Relation Age of Onset   Diabetes Mother    Hypertension Father     Social History   Socioeconomic History   Marital status: Married    Spouse name: Lauren   Number of children: 3   Years of education: Not on file   Highest education level: Some college, no degree  Occupational History   Not on file  Tobacco Use   Smoking status: Former   Smokeless tobacco: Not on file  Vaping Use   Vaping status: Never Used  Substance and Sexual Activity   Alcohol use: Never   Drug use: Yes    Types: Marijuana   Sexual activity: Not on file  Other Topics Concern   Not on file  Social History Narrative   Not on file   Social Drivers of Health   Financial Resource Strain: Low Risk  (01/25/2024)   Overall Financial Resource Strain (CARDIA)    Difficulty of Paying Living Expenses: Not hard at all  Food Insecurity: No Food Insecurity (01/25/2024)   Hunger Vital Sign    Worried About Running Out of Food in the Last Year: Never true    Ran Out of Food in the Last Year: Never true  Transportation Needs: No Transportation Needs (01/25/2024)   PRAPARE - Administrator, Civil Service (Medical): No    Lack of Transportation (Non-Medical): No  Physical Activity: Inactive (01/25/2024)   Exercise Vital Sign    Days of Exercise per Week: 0 days  Minutes of Exercise per Session: Not on file  Stress: No Stress Concern Present (01/25/2024)   [employer] of Occupational Health - Occupational Stress Questionnaire    Feeling of Stress: Not at all  Social Connections: Moderately Integrated (01/25/2024)   Social Connection and Isolation Panel    Frequency of Communication with Friends and Family: Twice a week    Frequency of Social Gatherings with Friends and Family: Twice a week    Attends Religious Services: 1 to 4 times per year    Active Member of [employer] or  Organizations: No    Attends Engineer, structural: Not on file    Marital Status: Married  Catering manager Violence: Not At Risk (01/18/2024)   Humiliation, Afraid, Rape, and Kick questionnaire    Fear of Current or Ex-Partner: No    Emotionally Abused: No    Physically Abused: No    Sexually Abused: No    ROS Review of Systems  Constitutional:  Negative for appetite change, chills, fatigue and fever.  HENT:  Negative for congestion, postnasal drip, rhinorrhea and sneezing.   Respiratory:  Negative for cough, shortness of breath and wheezing.   Cardiovascular:  Negative for chest pain, palpitations and leg swelling.  Gastrointestinal:  Negative for abdominal pain, constipation, nausea and vomiting.  Genitourinary:  Negative for difficulty urinating, dysuria, flank pain and frequency.  Musculoskeletal:  Negative for arthralgias, back pain, joint swelling and myalgias.  Skin:  Negative for color change, pallor, rash and wound.  Neurological:  Negative for dizziness, facial asymmetry, weakness, numbness and headaches.  Psychiatric/Behavioral:  Negative for behavioral problems, confusion, self-injury and suicidal ideas.     Objective:   Today's Vitals: There were no vitals taken for this visit.  Physical Exam Vitals and nursing note reviewed.  Constitutional:      General: He is not in acute distress.    Appearance: Normal appearance. He is obese. He is not ill-appearing, toxic-appearing or diaphoretic.  Eyes:     General: No scleral icterus.       Right eye: No discharge.        Left eye: No discharge.     Extraocular Movements: Extraocular movements intact.     Conjunctiva/sclera: Conjunctivae normal.  Cardiovascular:     Rate and Rhythm: Normal rate and regular rhythm.     Pulses: Normal pulses.     Heart sounds: Normal heart sounds. No murmur heard.    No friction rub. No gallop.  Pulmonary:     Effort: Pulmonary effort is normal. No respiratory distress.      Breath sounds: Normal breath sounds. No stridor. No wheezing, rhonchi or rales.  Chest:     Chest wall: No tenderness.  Abdominal:     General: There is no distension.     Palpations: Abdomen is soft.     Tenderness: There is no abdominal tenderness. There is no right CVA tenderness, left CVA tenderness or guarding.  Musculoskeletal:        General: No swelling, tenderness, deformity or signs of injury.     Right lower leg: No edema.     Left lower leg: No edema.  Skin:    General: Skin is warm and dry.     Capillary Refill: Capillary refill takes less than 2 seconds.     Coloration: Skin is not jaundiced or pale.     Findings: No bruising, erythema or lesion.  Neurological:     Mental Status: He is alert and oriented to  person, place, and time.     Motor: No weakness.     Coordination: Coordination normal.     Gait: Gait normal.  Psychiatric:        Mood and Affect: Mood normal.        Behavior: Behavior normal.        Thought Content: Thought content normal.        Judgment: Judgment normal.     Assessment & Plan:  Assessment and Plan Assessment & Plan     Problem List Items Addressed This Visit   None   Outpatient Encounter Medications as of 01/25/2024  Medication Sig   acetaminophen  (TYLENOL ) 500 MG tablet Take 500 mg by mouth every 6 (six) hours as needed for pain (pain).   insulin  aspart (NOVOLOG  FLEXPEN) 100 UNIT/ML FlexPen Inject 8 Units into the skin 3 (three) times daily with meals.   insulin  glargine (LANTUS ) 100 UNIT/ML Solostar Pen Inject 20 Units into the skin daily.   metoprolol  succinate (TOPROL -XL) 25 MG 24 hr tablet Take 1 tablet (25 mg total) by mouth daily.   pantoprazole  (PROTONIX ) 40 MG tablet Take 1 tablet (40 mg total) by mouth 2 (two) times daily.   No facility-administered encounter medications on file as of 01/25/2024.    Follow-up: No follow-ups on file.   Jakim Drapeau R Dayzee Trower, FNP

## 2024-01-25 NOTE — Assessment & Plan Note (Addendum)
 Lab Results  Component Value Date   ALT 53 (H) 01/18/2024   AST 35 01/18/2024   ALKPHOS 90 01/18/2024   BILITOT 1.8 (H) 01/18/2024  Rechecking labs

## 2024-01-26 LAB — HEPATIC FUNCTION PANEL
ALT: 46 IU/L — ABNORMAL HIGH (ref 0–44)
AST: 35 IU/L (ref 0–40)
Albumin: 4.2 g/dL (ref 4.1–5.1)
Alkaline Phosphatase: 107 IU/L (ref 44–121)
Bilirubin Total: 0.7 mg/dL (ref 0.0–1.2)
Bilirubin, Direct: 0.26 mg/dL (ref 0.00–0.40)
Total Protein: 6.8 g/dL (ref 6.0–8.5)

## 2024-01-26 LAB — CBC
Hematocrit: 31.4 % — ABNORMAL LOW (ref 37.5–51.0)
Hemoglobin: 10.9 g/dL — ABNORMAL LOW (ref 13.0–17.7)
MCH: 32 pg (ref 26.6–33.0)
MCHC: 34.7 g/dL (ref 31.5–35.7)
MCV: 92 fL (ref 79–97)
Platelets: 467 x10E3/uL — ABNORMAL HIGH (ref 150–450)
RBC: 3.41 x10E6/uL — ABNORMAL LOW (ref 4.14–5.80)
RDW: 12.4 % (ref 11.6–15.4)
WBC: 8.6 x10E3/uL (ref 3.4–10.8)

## 2024-01-26 LAB — BASIC METABOLIC PANEL WITH GFR
BUN/Creatinine Ratio: 8 — ABNORMAL LOW (ref 9–20)
BUN: 8 mg/dL (ref 6–24)
CO2: 23 mmol/L (ref 20–29)
Calcium: 9.9 mg/dL (ref 8.7–10.2)
Chloride: 98 mmol/L (ref 96–106)
Creatinine, Ser: 1.02 mg/dL (ref 0.76–1.27)
Glucose: 83 mg/dL (ref 70–99)
Potassium: 3.9 mmol/L (ref 3.5–5.2)
Sodium: 139 mmol/L (ref 134–144)
eGFR: 95 mL/min/1.73 (ref >59)

## 2024-01-26 LAB — MICROALBUMIN / CREATININE URINE RATIO
Creatinine, Urine: 594 mg/dL
Microalb/Creat Ratio: 3 mg/g{creat} (ref 0–29)
Microalbumin, Urine: 18.2 ug/mL

## 2024-01-28 ENCOUNTER — Ambulatory Visit: Payer: Self-pay | Admitting: Nurse Practitioner

## 2024-01-28 DIAGNOSIS — E785 Hyperlipidemia, unspecified: Secondary | ICD-10-CM

## 2024-01-28 MED ORDER — ROSUVASTATIN CALCIUM 10 MG PO TABS
10.0000 mg | ORAL_TABLET | Freq: Every day | ORAL | 1 refills | Status: AC
Start: 2024-01-28 — End: ?

## 2024-03-05 ENCOUNTER — Ambulatory Visit: Admitting: Family Medicine

## 2024-04-16 ENCOUNTER — Other Ambulatory Visit: Payer: Self-pay

## 2024-04-18 ENCOUNTER — Ambulatory Visit: Payer: Self-pay | Admitting: Nurse Practitioner

## 2024-05-22 ENCOUNTER — Other Ambulatory Visit

## 2024-05-22 ENCOUNTER — Ambulatory Visit: Admitting: "Endocrinology

## 2024-05-22 ENCOUNTER — Encounter: Payer: Self-pay | Admitting: "Endocrinology

## 2024-05-22 VITALS — BP 110/80 | HR 70 | Ht 71.0 in | Wt 237.0 lb

## 2024-05-22 DIAGNOSIS — E1165 Type 2 diabetes mellitus with hyperglycemia: Secondary | ICD-10-CM

## 2024-05-22 DIAGNOSIS — E782 Mixed hyperlipidemia: Secondary | ICD-10-CM | POA: Diagnosis not present

## 2024-05-22 DIAGNOSIS — Z794 Long term (current) use of insulin: Secondary | ICD-10-CM

## 2024-05-22 DIAGNOSIS — E119 Type 2 diabetes mellitus without complications: Secondary | ICD-10-CM

## 2024-05-22 LAB — GLUCOSE, POCT (MANUAL RESULT ENTRY)
POC Glucose: 117 mg/dL — AB (ref 70–99)
POC Glucose: 128 mg/dL — AB (ref 70–99)

## 2024-05-22 MED ORDER — DEXCOM G7 SENSOR MISC: 1.0000 | 3 refills | Status: AC

## 2024-05-22 MED ORDER — BAQSIMI ONE PACK 3 MG/DOSE NA POWD: 1.0000 | NASAL | 3 refills | Status: AC | PRN

## 2024-05-22 NOTE — Patient Instructions (Addendum)
°  Will recommend the following: Lantus  18 units qam Novolog  6 untis tidac   ___________   Goals of DM therapy:  Morning Fasting blood sugar: 80-140  Blood sugar before meals: 80-140 Bed time blood sugar: 100-150  A1C <7%, limited only by hypoglycemia  1.Diabetes medications and their side effects discussed, including hypoglycemia    2. Check blood glucose:  a) Always check blood sugars before driving. Please see below (under hypoglycemia) on how to manage b) Check a minimum of 3 times/day or more as needed when having symptoms of hypoglycemia.   c) Try to check blood glucose before sleeping/in the middle of the night to ensure that it is remaining stable and not dropping less than 100 d) Check blood glucose more often if sick  3. Diet: a) 3 meals per day schedule b: Restrict carbs to 60-70 grams (4 servings) per meal c) Colorful vegetables - 3 servings a day, and low sugar fruit 2 servings/day Plate control method: 1/4 plate protein, 1/4 starch, 1/2 green, yellow, or red vegetables d) Avoid carbohydrate snacks unless hypoglycemic episode, or increased physical activity  4. Regular exercise as tolerated, preferably 3 or more hours a week  5. Hypoglycemia: a)  Do not drive or operate machinery without first testing blood glucose to assure it is over 90 mg%, or if dizzy, lightheaded, not feeling normal, etc, or  if foot or leg is numb or weak. b)  If blood glucose less than 70, take four 5gm Glucose tabs or 15-30 gm Glucose gel.  Repeat every 15 min as needed until blood sugar is >100 mg/dl. If hypoglycemia persists then call 911.   6. Sick day management: a) Check blood glucose more often b) Continue usual therapy if blood sugars are elevated.   7. Contact the doctor immediately if blood glucose is frequently <60 mg/dl, or an episode of severe hypoglycemia occurs (where someone had to give you glucose/  glucagon  or if you passed out from a low blood glucose), or if blood  glucose is persistently >350 mg/dl, for further management  8. A change in level of physical activity or exercise and a change in diet may also affect your blood sugar. Check blood sugars more often and call if needed.  Instructions: 1. Bring glucose meter, blood glucose records on every visit for review 2. Continue to follow up with primary care physician and other providers for medical care 3. Yearly eye  and foot exam 4. Please get blood work done prior to the next appointment

## 2024-05-22 NOTE — Addendum Note (Signed)
 Addended by: ARLOA JEOFFREY SAILOR on: 05/22/2024 11:20 AM   Modules accepted: Orders

## 2024-05-22 NOTE — Progress Notes (Signed)
 Outpatient Endocrinology Note Carlos Birmingham, MD  05/22/2024   Carlos Mccoy [birth year] [id number]  Referring Provider: Madelyne Owen LABOR, MD Primary Care Provider: Paseda, Folashade R, FNP Reason for consultation: Subjective   Assessment & Plan  Diagnoses and all orders for this visit:  Controlled diabetes mellitus (HCC) -     Cancel: POCT glycosylated hemoglobin (Hb A1C) -     Continuous Glucose Sensor (DEXCOM G7 SENSOR) MISC; 1 Device by Does not apply route continuous. -     Ambulatory referral to diabetic education -     Glucagon  (BAQSIMI  ONE PACK) 3 MG/DOSE POWD; Place 1 Device into the nose as needed (Low blood sugar with impaired consciousness). -     GAD65, IA-2, and Insulin  Autoantibody serum -     C-peptide -     Comprehensive metabolic panel with GFR -     Hemoglobin A1c -     POCT glucose (manual entry)  Long-term insulin  use (HCC)  Mixed hypercholesterolemia and hypertriglyceridemia   Diabetes Type II complicated by hyperglycemia, No results found for: GFR Hba1c goal less than 7, current Hba1c is  Lab Results  Component Value Date   HGBA1C 9.9 (H) 01/12/2024   Will recommend the following: Lantus  18 units qam Novolog  6 untis tidac 15 min before meals  Labs today to diagnoses type of diabetes 05/22/24: extensive diabetes education provided, ordered the same, also ordered DexCom Patient cannot afford Libre 3 +   No known contraindications/side effects to any of above medications Glucagon  discussed and prescribed with refills on 05/22/2024  -Last LD and Tg are as follows: Lab Results  Component Value Date   LDLCALC 133 (H) 01/12/2024    Lab Results  Component Value Date   TRIG 251 (H) 01/12/2024   -on rosuvastatin  10 mg QD -Follow low fat diet and exercise   -Blood pressure goal <140/90 - Microalbumin/creatinine goal is < 30 -Last MA/Cr is as follows: No results found for: MICROALBUR, MALB24HUR -not on ACE/ARB  -diet changes  including salt restriction -limit eating outside -counseled BP targets per standards of diabetes care -uncontrolled blood pressure can lead to retinopathy, nephropathy and cardiovascular and atherosclerotic heart disease  Reviewed and counseled on: -A1C target -Blood sugar targets -Complications of uncontrolled diabetes  -Checking blood sugar before meals and bedtime and bring log next visit -All medications with mechanism of action and side effects -Hypoglycemia management: rule of 15's, Glucagon  Emergency Kit and medical alert ID -low-carb low-fat plate-method diet -At least 20 minutes of physical activity per day -Annual dilated retinal eye exam and foot exam -compliance and follow up needs -follow up as scheduled or earlier if problem gets worse  Call if blood sugar is less than 70 or consistently above 250    Take a 15 gm snack of carbohydrate at bedtime before you go to sleep if your blood sugar is less than 100.    If you are going to fast after midnight for a test or procedure, ask your physician for instructions on how to reduce/decrease your insulin  dose.    Call if blood sugar is less than 70 or consistently above 250  -Treating a low sugar by rule of 15  (15 gms of sugar every 15 min until sugar is more than 70) If you feel your sugar is low, test your sugar to be sure If your sugar is low (less than 70), then take 15 grams of a fast acting Carbohydrate (3-4 glucose tablets or glucose gel or 4  ounces of juice or regular soda) Recheck your sugar 15 min after treating low to make sure it is more than 70 If sugar is still less than 70, treat again with 15 grams of carbohydrate          Don't drive the hour of hypoglycemia  If unconscious/unable to eat or drink by mouth, use glucagon  injection or nasal spray baqsimi  and call 911. Can repeat again in 15 min if still unconscious.  Return in about 3 months (around 08/20/2024) for visit, labs today.   I have reviewed current  medications, nurse's notes, allergies, vital signs, past medical and surgical history, family medical history, and social history for this encounter. Counseled patient on symptoms, examination findings, lab findings, imaging results, treatment decisions and monitoring and prognosis. The patient understood the recommendations and agrees with the treatment plan. All questions regarding treatment plan were fully answered.  Carlos Birmingham, MD  05/22/2024    History of Present Illness Carlos Mccoy is a 43 y.o. year old male who presents for evaluation of Type II diabetes mellitus.  Keiandre Casados was first diagnosed in 12/2023 with DKA.   Diabetes education -  Home diabetes regimen: Lantus  20 units qam Novolog  8 units tidac   COMPLICATIONS -  MI/Stroke -  retinopathy -  neuropathy -  nephropathy  SYMPTOMS REVIEWED - Polyuria - Weight loss - Blurred vision  BLOOD SUGAR DATA Checks 0-3 times/day 70-103  Physical Exam  BP 110/80   Pulse 70   Ht 5' 11 (1.803 m)   Wt 237 lb (107.5 kg)   SpO2 98%   BMI 33.05 kg/m    Constitutional: well developed, well nourished Head: normocephalic, atraumatic Eyes: sclera anicteric, no redness Neck: supple Lungs: normal respiratory effort Neurology: alert and oriented Skin: dry, no appreciable rashes Musculoskeletal: no appreciable defects Psychiatric: normal mood and affect Diabetic Foot Exam - Simple   No data filed      Current Medications Patient's Medications  New Prescriptions   CONTINUOUS GLUCOSE SENSOR (DEXCOM G7 SENSOR) MISC    1 Device by Does not apply route continuous.   GLUCAGON  (BAQSIMI  ONE PACK) 3 MG/DOSE POWD    Place 1 Device into the nose as needed (Low blood sugar with impaired consciousness).  Previous Medications   ACETAMINOPHEN  (TYLENOL ) 500 MG TABLET    Take 500 mg by mouth every 6 (six) hours as needed for pain (pain).   INSULIN  ASPART (NOVOLOG  FLEXPEN) 100 UNIT/ML FLEXPEN    Inject 8 Units into the  skin 3 (three) times daily with meals.   INSULIN  GLARGINE (LANTUS ) 100 UNIT/ML SOLOSTAR PEN    Inject 20 Units into the skin daily.   METOPROLOL  SUCCINATE (TOPROL -XL) 25 MG 24 HR TABLET    Take 1 tablet (25 mg total) by mouth daily.   PANTOPRAZOLE  (PROTONIX ) 40 MG TABLET    Take 1 tablet (40 mg total) by mouth 2 (two) times daily.   ROSUVASTATIN  (CRESTOR ) 10 MG TABLET    Take 1 tablet (10 mg total) by mouth daily.  Modified Medications   No medications on file  Discontinued Medications   CONTINUOUS GLUCOSE SENSOR (FREESTYLE LIBRE 3 PLUS SENSOR) MISC    Use as directed.Change sensor every 15 days.    Allergies Allergies[1]  Past Medical History Past Medical History:  Diagnosis Date   Diabetes mellitus without complication (HCC)    Hypertension     Past Surgical History Past Surgical History:  Procedure Laterality Date   ESOPHAGOGASTRODUODENOSCOPY N/A 01/18/2024  Procedure: EGD (ESOPHAGOGASTRODUODENOSCOPY);  Surgeon: Elicia Claw, MD;  Location: [facility];  Service: Gastroenterology;  Laterality: N/A;    Family History family history includes Diabetes in his maternal grandmother and mother; Hypertension in his father.  Social History Social History   Socioeconomic History   Marital status: Married    Spouse name: Lauren   Number of children: 3   Years of education: Not on file   Highest education level: Some college, no degree  Occupational History   Not on file  Tobacco Use   Smoking status: Never   Smokeless tobacco: Not on file  Vaping Use   Vaping status: Never Used  Substance and Sexual Activity   Alcohol use: Never   Drug use: Not Currently    Types: Marijuana   Sexual activity: Not Currently  Other Topics Concern   Not on file  Social History Narrative   Lives with his wife and children    Social Drivers of Health   Tobacco Use: Unknown (05/22/2024)   Patient History    Smoking Tobacco Use: Never    Smokeless Tobacco Use: Unknown    Passive  Exposure: Not on file  Financial Resource Strain: Low Risk (01/25/2024)   Overall Financial Resource Strain (CARDIA)    Difficulty of Paying Living Expenses: Not hard at all  Food Insecurity: No Food Insecurity (01/25/2024)   Epic    Worried About Radiation Protection Practitioner of Food in the Last Year: Never true    Ran Out of Food in the Last Year: Never true  Transportation Needs: No Transportation Needs (01/25/2024)   Epic    Lack of Transportation (Medical): No    Lack of Transportation (Non-Medical): No  Physical Activity: Inactive (01/25/2024)   Exercise Vital Sign    Days of Exercise per Week: 0 days    Minutes of Exercise per Session: Not on file  Stress: No Stress Concern Present (01/25/2024)   [employer] of Occupational Health - Occupational Stress Questionnaire    Feeling of Stress: Not at all  Social Connections: Moderately Integrated (01/25/2024)   Social Connection and Isolation Panel    Frequency of Communication with Friends and Family: Twice a week    Frequency of Social Gatherings with Friends and Family: Twice a week    Attends Religious Services: 1 to 4 times per year    Active Member of [employer] or Organizations: No    Attends Banker Meetings: Not on file    Marital Status: Married  Intimate Partner Violence: Not At Risk (01/18/2024)   Epic    Fear of Current or Ex-Partner: No    Emotionally Abused: No    Physically Abused: No    Sexually Abused: No  Depression (PHQ2-9): Low Risk (01/25/2024)   Depression (PHQ2-9)    PHQ-2 Score: 0  Alcohol Screen: Not on file  Housing: Low Risk (01/25/2024)   Epic    Unable to Pay for Housing in the Last Year: No    Number of Times Moved in the Last Year: 0    Homeless in the Last Year: No  Utilities: Not At Risk (01/18/2024)   Epic    Threatened with loss of utilities: No  Health Literacy: Not on file    Lab Results  Component Value Date   HGBA1C 9.9 (H) 01/12/2024   Lab Results  Component Value Date   CHOL 212 (H)  01/12/2024   Lab Results  Component Value Date   HDL 29 (L) 01/12/2024   Lab  Results  Component Value Date   LDLCALC 133 (H) 01/12/2024   Lab Results  Component Value Date   TRIG 251 (H) 01/12/2024   Lab Results  Component Value Date   CHOLHDL 7.3 01/12/2024   Lab Results  Component Value Date   CREATININE 1.02 01/25/2024   No results found for: GFR No results found for: MACKEY CURRENT    Component Value Date/Time   NA 139 01/25/2024 1444   K 3.9 01/25/2024 1444   CL 98 01/25/2024 1444   CO2 23 01/25/2024 1444   GLUCOSE 83 01/25/2024 1444   GLUCOSE 145 (H) 01/18/2024 2355   BUN 8 01/25/2024 1444   CREATININE 1.02 01/25/2024 1444   CALCIUM  9.9 01/25/2024 1444   PROT 6.8 01/25/2024 1444   ALBUMIN 4.2 01/25/2024 1444   AST 35 01/25/2024 1444   ALT 46 (H) 01/25/2024 1444   ALKPHOS 107 01/25/2024 1444   BILITOT 0.7 01/25/2024 1444   GFRNONAA >60 01/18/2024 2355      Latest Ref Rng & Units 01/25/2024    2:44 PM 01/18/2024   11:55 PM 01/18/2024    1:30 AM  BMP  Glucose 70 - 99 mg/dL 83  854  739   BUN 6 - 24 mg/dL 8  13  23    Creatinine 0.76 - 1.27 mg/dL 8.97  8.78  8.85   BUN/Creat Ratio 9 - 20 8     Sodium 134 - 144 mmol/L 139  137  134   Potassium 3.5 - 5.2 mmol/L 3.9  2.9  3.0   Chloride 96 - 106 mmol/L 98  105  99   CO2 20 - 29 mmol/L 23  24  20    Calcium  8.7 - 10.2 mg/dL 9.9  9.0  9.5        Component Value Date/Time   WBC 8.6 01/25/2024 1444   WBC 11.0 (H) 01/20/2024 0312   RBC 3.41 (L) 01/25/2024 1444   RBC 3.09 (L) 01/20/2024 0312   HGB 10.9 (L) 01/25/2024 1444   HCT 31.4 (L) 01/25/2024 1444   PLT 467 (H) 01/25/2024 1444   MCV 92 01/25/2024 1444   MCH 32.0 01/25/2024 1444   MCH 31.7 01/20/2024 0312   MCHC 34.7 01/25/2024 1444   MCHC 36.2 (H) 01/20/2024 0312   RDW 12.4 01/25/2024 1444   LYMPHSABS 3.1 01/18/2024 0302   MONOABS 1.3 (H) 01/18/2024 0302   EOSABS 0.0 01/18/2024 0302   BASOSABS 0.0 01/18/2024 0302     Parts of this  note may have been dictated using voice recognition software. There may be variances in spelling and vocabulary which are unintentional. Not all errors are proofread. Please notify the dino if any discrepancies are noted or if the meaning of any statement is not clear.      [1] No Known Allergies

## 2024-05-23 LAB — HEMOGLOBIN A1C
Hgb A1c MFr Bld: 5.4 %
Mean Plasma Glucose: 108 mg/dL
eAG (mmol/L): 6 mmol/L

## 2024-05-29 LAB — COMPREHENSIVE METABOLIC PANEL WITH GFR
AG Ratio: 1.8 (calc) (ref 1.0–2.5)
ALT: 12 U/L (ref 9–46)
AST: 11 U/L (ref 10–40)
Albumin: 4.4 g/dL (ref 3.6–5.1)
Alkaline phosphatase (APISO): 91 U/L (ref 36–130)
BUN: 10 mg/dL (ref 7–25)
CO2: 30 mmol/L (ref 20–32)
Calcium: 9.9 mg/dL (ref 8.6–10.3)
Chloride: 106 mmol/L (ref 98–110)
Creat: 1.13 mg/dL (ref 0.60–1.29)
Globulin: 2.5 g/dL (ref 1.9–3.7)
Glucose, Bld: 129 mg/dL — ABNORMAL HIGH (ref 65–99)
Potassium: 3.8 mmol/L (ref 3.5–5.3)
Sodium: 144 mmol/L (ref 135–146)
Total Bilirubin: 0.6 mg/dL (ref 0.2–1.2)
Total Protein: 6.9 g/dL (ref 6.1–8.1)
eGFR: 84 mL/min/1.73m2

## 2024-05-29 LAB — DIABETES TYPE 1 AUTOANTIBODY PANEL
Glutamic Acid Decarb Ab: <5 [IU]/mL
IA-2 Antibody: <5.4 U/mL
Insulin Antibodies, Human: <0.4 U/mL
ZINC TRANSPORTER 8 (ZnT8) ANTIBODY: <10 U/mL

## 2024-05-29 LAB — C-PEPTIDE: C-Peptide: 4.21 ng/mL — ABNORMAL HIGH (ref 0.80–3.85)

## 2024-05-30 ENCOUNTER — Ambulatory Visit: Payer: Self-pay | Admitting: "Endocrinology

## 2024-06-24 ENCOUNTER — Encounter: Payer: Self-pay | Admitting: "Endocrinology

## 2024-06-24 DIAGNOSIS — E119 Type 2 diabetes mellitus without complications: Secondary | ICD-10-CM

## 2024-06-24 MED ORDER — INSULIN GLARGINE 100 UNIT/ML SOLOSTAR PEN: 18.0000 [IU] | PEN_INJECTOR | Freq: Every day | SUBCUTANEOUS | 0 refills | Status: AC

## 2024-06-24 MED ORDER — NOVOLOG FLEXPEN RELION 100 UNIT/ML ~~LOC~~ SOPN: PEN_INJECTOR | SUBCUTANEOUS | 2 refills | Status: AC

## 2024-08-20 ENCOUNTER — Ambulatory Visit: Admitting: "Endocrinology
